# Patient Record
Sex: Male | Born: 1960 | Race: Black or African American | Hispanic: No | Marital: Single | Smoking: Current every day smoker
Health system: Southern US, Community
[De-identification: ages and names within clinical notes are randomized; demographics above are authoritative.]

## PROBLEM LIST (undated history)

## (undated) DIAGNOSIS — E119 Type 2 diabetes mellitus without complications: Secondary | ICD-10-CM

## (undated) DIAGNOSIS — K219 Gastro-esophageal reflux disease without esophagitis: Secondary | ICD-10-CM

## (undated) DIAGNOSIS — I10 Essential (primary) hypertension: Secondary | ICD-10-CM

## (undated) HISTORY — DX: Type 2 diabetes mellitus without complications: E11.9

## (undated) HISTORY — DX: Essential (primary) hypertension: I10

## (undated) HISTORY — DX: Gastro-esophageal reflux disease without esophagitis: K21.9

---

## 2010-09-27 ENCOUNTER — Ambulatory Visit: Payer: Self-pay | Admitting: Internal Medicine

## 2020-03-12 ENCOUNTER — Telehealth: Payer: Self-pay

## 2020-03-12 NOTE — Telephone Encounter (Signed)
Contacted patient for lung screening CT program based on referral from Dr. Fitzgerald.  Message left with patient to call Shawn Perkins, lung navigator to schedule CT scan.  °

## 2020-03-18 ENCOUNTER — Encounter: Payer: Self-pay | Admitting: *Deleted

## 2020-03-18 ENCOUNTER — Other Ambulatory Visit: Payer: Self-pay

## 2020-03-18 ENCOUNTER — Encounter: Payer: Managed Care, Other (non HMO) | Attending: Infectious Diseases | Admitting: *Deleted

## 2020-03-18 VITALS — BP 138/84 | Ht 74.0 in | Wt 313.1 lb

## 2020-03-18 DIAGNOSIS — E119 Type 2 diabetes mellitus without complications: Secondary | ICD-10-CM | POA: Diagnosis not present

## 2020-03-18 DIAGNOSIS — Z713 Dietary counseling and surveillance: Secondary | ICD-10-CM | POA: Insufficient documentation

## 2020-03-18 DIAGNOSIS — Z6841 Body Mass Index (BMI) 40.0 and over, adult: Secondary | ICD-10-CM | POA: Diagnosis not present

## 2020-03-18 NOTE — Progress Notes (Signed)
Diabetes Self-Management Education  Visit Type: First/Initial  Appt. Start Time: 1600 Appt. End Time: 1710  03/18/2020  Mr. Shane Caldwell, identified by name and date of birth, is a 59 y.o. male with a diagnosis of Diabetes: Type 2.   ASSESSMENT  Blood pressure 138/84, height 6\' 2"  (1.88 m), weight (!) 313 lb 1.6 oz (142 kg). Body mass index is 40.2 kg/m.   Diabetes Self-Management Education - 03/18/20 1807      Visit Information   Visit Type First/Initial      Initial Visit   Diabetes Type Type 2    Are you currently following a meal plan? No    Are you taking your medications as prescribed? Yes    Date Diagnosed September 2021      Health Coping   How would you rate your overall health? Fair      Psychosocial Assessment   Patient Belief/Attitude about Diabetes Motivated to manage diabetes   "family history, it's ok"   Self-care barriers None    Self-management support Doctor's office;Friends;Family    Patient Concerns Nutrition/Meal planning;Weight Control;Medication;Healthy Lifestyle;Problem Solving;Glycemic Control    Special Needs None    Preferred Learning Style Auditory    Learning Readiness Change in progress    How often do you need to have someone help you when you read instructions, pamphlets, or other written materials from your doctor or pharmacy? 1 - Never    What is the last grade level you completed in school? 12th      Pre-Education Assessment   Patient understands the diabetes disease and treatment process. Needs Instruction    Patient understands incorporating nutritional management into lifestyle. Needs Instruction    Patient undertands incorporating physical activity into lifestyle. Needs Instruction    Patient understands using medications safely. Needs Instruction    Patient understands monitoring blood glucose, interpreting and using results Needs Review    Patient understands prevention, detection, and treatment of acute complications. Needs  Instruction    Patient understands prevention, detection, and treatment of chronic complications. Needs Instruction    Patient understands how to develop strategies to address psychosocial issues. Needs Instruction    Patient understands how to develop strategies to promote health/change behavior. Needs Instruction      Complications   Last HgB A1C per patient/outside source 8.7 %   02/13/2020   How often do you check your blood sugar? 1-2 times/day    Postprandial Blood glucose range (mg/dL) 02/15/2020   Pt reports checking after meals with readings less than 130's mg/dL.   Have you had a dilated eye exam in the past 12 months? Yes    Have you had a dental exam in the past 12 months? No    Are you checking your feet? Yes    How many days per week are you checking your feet? 3      Dietary Intake   Breakfast sausage, egg and cheese biscuit from [employer] or potato wedges    Lunch skips or eats fruit (banana, strawberries, peach, orange)  or cereal and milk    Dinner chicken, beef, pork, fish with potatoes, corn, beans, rice, broccoli, cauliflower, salad, kale, lettuce, tomatoes, cuccumbers    Beverage(s) water, regular soda, coffee      Exercise   Exercise Type ADL's      Patient Education   Previous Diabetes Education No    Disease state  Definition of diabetes, type 1 and 2, and the diagnosis of diabetes;Factors that contribute to the  development of diabetes    Nutrition management  Role of diet in the treatment of diabetes and the relationship between the three main macronutrients and blood glucose level;Food label reading, portion sizes and measuring food.;Reviewed blood glucose goals for pre and post meals and how to evaluate the patients' food intake on their blood glucose level.;Information on hints to eating out and maintain blood glucose control.    Physical activity and exercise  Role of exercise on diabetes management, blood pressure control and cardiac health.     Medications Reviewed patients medication for diabetes, action, purpose, timing of dose and side effects.    Monitoring Purpose and frequency of SMBG.;Taught/discussed recording of test results and interpretation of SMBG.;Identified appropriate SMBG and/or A1C goals.    Chronic complications Relationship between chronic complications and blood glucose control    Psychosocial adjustment Identified and addressed patients feelings and concerns about diabetes    Personal strategies to promote health Review risk of smoking and offered smoking cessation      Individualized Goals (developed by patient)   Reducing Risk Other (comment)   improve blood sugars, decrease medications, lose weight, lead a healthier lifestyle, quit smoking, become more fit     Outcomes   Expected Outcomes Demonstrated interest in learning. Expect positive outcomes    Future DMSE --   3 weeks          Individualized Plan for Diabetes Self-Management Training:   Learning Objective:  Patient will have a greater understanding of diabetes self-management. Patient education plan is to attend individual and/or group sessions per assessed needs and concerns.   Plan:   Patient Instructions  Check blood sugars 1 x day before breakfast or 2 hrs after one meal every day Bring blood sugar records to the next appointment Exercise: Begin walking  for  15  minutes  3  days a week and gradually increase to 30 minutes 5 x week Eat 3 meals day, 1-2  snacks a day Space meals 4-6 hours apart Don't skip meals Avoid sugar sweetened drinks (soda) Limit fried foods and foods high in fat and desserts/sweets Return for appointment on:  Monday April 08, 2020 at 4:00 pm with Reanna (dietitian)  Expected Outcomes:  Demonstrated interest in learning. Expect positive outcomes  Education material provided:  General Meal Planning Guidelines Simple Meal Plan  If problems or questions, patient to contact team via:  Sharion Settler, RN, CCM,  CDCES [phone]  Future DSME appointment:  (3 weeks)  April 08, 2020 with the dietitian

## 2020-03-18 NOTE — Patient Instructions (Addendum)
Check blood sugars 1 x day before breakfast or 2 hrs after one meal every day Bring blood sugar records to the next appointment  Exercise: Begin walking  for  15  minutes  3  days a week and gradually increase to 30 minutes 5 x week  Eat 3 meals day, 1-2  snacks a day Space meals 4-6 hours apart Don't skip meals Avoid sugar sweetened drinks (soda) Limit fried foods and foods high in fat and desserts/sweets  Return for appointment on:  Monday April 08, 2020 at 4:00 pm with Reanna (dietitian)

## 2020-03-26 ENCOUNTER — Telehealth: Payer: Self-pay

## 2020-03-26 NOTE — Telephone Encounter (Signed)
Contacted patient for lung CT screening program based on referral from Dr. Fitzgerald.  Message left for patient to call Shawn Perkins, lung navigator to schedule CT scan.  

## 2020-04-08 ENCOUNTER — Telehealth: Payer: Self-pay

## 2020-04-08 ENCOUNTER — Ambulatory Visit: Payer: Managed Care, Other (non HMO) | Admitting: Dietician

## 2020-04-08 NOTE — Telephone Encounter (Signed)
Contacted patient for lung CT screening program based on referral from Dr. Sampson Goon.  Message left for patient to call Glenna Fellows, lung navigator at [phone] to schedule CT scan.

## 2020-04-09 ENCOUNTER — Encounter: Payer: Self-pay | Admitting: *Deleted

## 2020-04-09 ENCOUNTER — Encounter: Payer: Self-pay | Admitting: Dietician

## 2020-04-09 NOTE — Progress Notes (Signed)
Left message yesterday (11/15) for patient regarding missed follow up diabetes education appointment with dietitian.  Instructed patient to call if interested in rescheduling appointment.

## 2020-05-09 ENCOUNTER — Encounter: Payer: Self-pay | Admitting: Dietician

## 2020-05-09 NOTE — Progress Notes (Signed)
Pt did not show for 11/15 appointment and has not contacted our office to reschedule.  Pt is being discharged.

## 2020-11-11 ENCOUNTER — Inpatient Hospital Stay (HOSPITAL_COMMUNITY): Payer: 59

## 2020-11-11 ENCOUNTER — Emergency Department (HOSPITAL_COMMUNITY): Payer: 59

## 2020-11-11 ENCOUNTER — Inpatient Hospital Stay (HOSPITAL_COMMUNITY)
Admission: EM | Admit: 2020-11-11 | Discharge: 2020-11-22 | DRG: 296 | Disposition: E | Payer: 59 | Attending: Pulmonary Disease | Admitting: Pulmonary Disease

## 2020-11-11 DIAGNOSIS — J9601 Acute respiratory failure with hypoxia: Secondary | ICD-10-CM | POA: Diagnosis present

## 2020-11-11 DIAGNOSIS — Z79899 Other long term (current) drug therapy: Secondary | ICD-10-CM

## 2020-11-11 DIAGNOSIS — I4892 Unspecified atrial flutter: Secondary | ICD-10-CM | POA: Diagnosis present

## 2020-11-11 DIAGNOSIS — F17210 Nicotine dependence, cigarettes, uncomplicated: Secondary | ICD-10-CM | POA: Diagnosis present

## 2020-11-11 DIAGNOSIS — I10 Essential (primary) hypertension: Secondary | ICD-10-CM | POA: Diagnosis present

## 2020-11-11 DIAGNOSIS — I4510 Unspecified right bundle-branch block: Secondary | ICD-10-CM | POA: Diagnosis present

## 2020-11-11 DIAGNOSIS — G9382 Brain death: Secondary | ICD-10-CM | POA: Diagnosis present

## 2020-11-11 DIAGNOSIS — K219 Gastro-esophageal reflux disease without esophagitis: Secondary | ICD-10-CM | POA: Diagnosis present

## 2020-11-11 DIAGNOSIS — G931 Anoxic brain damage, not elsewhere classified: Secondary | ICD-10-CM | POA: Diagnosis present

## 2020-11-11 DIAGNOSIS — Z833 Family history of diabetes mellitus: Secondary | ICD-10-CM | POA: Diagnosis not present

## 2020-11-11 DIAGNOSIS — N170 Acute kidney failure with tubular necrosis: Secondary | ICD-10-CM | POA: Diagnosis present

## 2020-11-11 DIAGNOSIS — Z66 Do not resuscitate: Secondary | ICD-10-CM | POA: Diagnosis not present

## 2020-11-11 DIAGNOSIS — G936 Cerebral edema: Secondary | ICD-10-CM | POA: Diagnosis present

## 2020-11-11 DIAGNOSIS — Z6841 Body Mass Index (BMI) 40.0 and over, adult: Secondary | ICD-10-CM | POA: Diagnosis not present

## 2020-11-11 DIAGNOSIS — E876 Hypokalemia: Secondary | ICD-10-CM | POA: Diagnosis present

## 2020-11-11 DIAGNOSIS — I469 Cardiac arrest, cause unspecified: Principal | ICD-10-CM

## 2020-11-11 DIAGNOSIS — Z7984 Long term (current) use of oral hypoglycemic drugs: Secondary | ICD-10-CM

## 2020-11-11 DIAGNOSIS — K559 Vascular disorder of intestine, unspecified: Secondary | ICD-10-CM | POA: Diagnosis present

## 2020-11-11 DIAGNOSIS — E874 Mixed disorder of acid-base balance: Secondary | ICD-10-CM | POA: Diagnosis present

## 2020-11-11 DIAGNOSIS — Z20822 Contact with and (suspected) exposure to covid-19: Secondary | ICD-10-CM | POA: Diagnosis present

## 2020-11-11 DIAGNOSIS — Z452 Encounter for adjustment and management of vascular access device: Secondary | ICD-10-CM

## 2020-11-11 DIAGNOSIS — E1165 Type 2 diabetes mellitus with hyperglycemia: Secondary | ICD-10-CM | POA: Diagnosis present

## 2020-11-11 DIAGNOSIS — I472 Ventricular tachycardia: Secondary | ICD-10-CM | POA: Diagnosis present

## 2020-11-11 DIAGNOSIS — R570 Cardiogenic shock: Secondary | ICD-10-CM

## 2020-11-11 DIAGNOSIS — I249 Acute ischemic heart disease, unspecified: Secondary | ICD-10-CM | POA: Diagnosis present

## 2020-11-11 LAB — RESP PANEL BY RT-PCR (FLU A&B, COVID) ARPGX2
Influenza A by PCR: NEGATIVE
Influenza B by PCR: NEGATIVE
SARS Coronavirus 2 by RT PCR: NEGATIVE

## 2020-11-11 LAB — I-STAT VENOUS BLOOD GAS, ED
Acid-base deficit: 22 mmol/L — ABNORMAL HIGH (ref 0.0–2.0)
Bicarbonate: 13.7 mmol/L — ABNORMAL LOW (ref 20.0–28.0)
Calcium, Ion: 1.49 mmol/L — ABNORMAL HIGH (ref 1.15–1.40)
HCT: 40 % (ref 39.0–52.0)
Hemoglobin: 13.6 g/dL (ref 13.0–17.0)
O2 Saturation: 45 %
Potassium: 4.2 mmol/L (ref 3.5–5.1)
Sodium: 134 mmol/L — ABNORMAL LOW (ref 135–145)
TCO2: 16 mmol/L — ABNORMAL LOW (ref 22–32)
pCO2, Ven: 84.9 mmHg (ref 44.0–60.0)
pH, Ven: 6.814 — CL (ref 7.250–7.430)
pO2, Ven: 46 mmHg — ABNORMAL HIGH (ref 32.0–45.0)

## 2020-11-11 LAB — I-STAT ARTERIAL BLOOD GAS, ED
Acid-base deficit: 12 mmol/L — ABNORMAL HIGH (ref 0.0–2.0)
Acid-base deficit: 14 mmol/L — ABNORMAL HIGH (ref 0.0–2.0)
Bicarbonate: 18.5 mmol/L — ABNORMAL LOW (ref 20.0–28.0)
Bicarbonate: 16.7 mmol/L — ABNORMAL LOW (ref 20.0–28.0)
Calcium, Ion: 1.5 mmol/L — ABNORMAL HIGH (ref 1.15–1.40)
Calcium, Ion: 1.28 mmol/L (ref 1.15–1.40)
HCT: 44 % (ref 39.0–52.0)
HCT: 41 % (ref 39.0–52.0)
Hemoglobin: 15 g/dL (ref 13.0–17.0)
Hemoglobin: 13.9 g/dL (ref 13.0–17.0)
O2 Saturation: 85 %
O2 Saturation: 62 %
Patient temperature: 97.3
Patient temperature: 97.3
Potassium: 4.2 mmol/L (ref 3.5–5.1)
Potassium: 2.8 mmol/L — ABNORMAL LOW (ref 3.5–5.1)
Sodium: 134 mmol/L — ABNORMAL LOW (ref 135–145)
Sodium: 138 mmol/L (ref 135–145)
TCO2: 20 mmol/L — ABNORMAL LOW (ref 22–32)
TCO2: 19 mmol/L — ABNORMAL LOW (ref 22–32)
pCO2 arterial: 59.5 mmHg — ABNORMAL HIGH (ref 32.0–48.0)
pCO2 arterial: 58.4 mmHg — ABNORMAL HIGH (ref 32.0–48.0)
pH, Arterial: 7.097 — CL (ref 7.350–7.450)
pH, Arterial: 7.061 — CL (ref 7.350–7.450)
pO2, Arterial: 65 mmHg — ABNORMAL LOW (ref 83.0–108.0)
pO2, Arterial: 44 mmHg — ABNORMAL LOW (ref 83.0–108.0)

## 2020-11-11 LAB — POCT I-STAT 7, (LYTES, BLD GAS, ICA,H+H)
Acid-base deficit: 15 mmol/L — ABNORMAL HIGH (ref 0.0–2.0)
Bicarbonate: 16.6 mmol/L — ABNORMAL LOW (ref 20.0–28.0)
Calcium, Ion: 1.36 mmol/L (ref 1.15–1.40)
HCT: 44 % (ref 39.0–52.0)
Hemoglobin: 15 g/dL (ref 13.0–17.0)
O2 Saturation: 59 %
Patient temperature: 97.7
Potassium: 3.3 mmol/L — ABNORMAL LOW (ref 3.5–5.1)
Sodium: 135 mmol/L (ref 135–145)
TCO2: 19 mmol/L — ABNORMAL LOW (ref 22–32)
pCO2 arterial: 63.1 mmHg — ABNORMAL HIGH (ref 32.0–48.0)
pH, Arterial: 7.025 — CL (ref 7.350–7.450)
pO2, Arterial: 44 mmHg — ABNORMAL LOW (ref 83.0–108.0)

## 2020-11-11 LAB — CBC WITH DIFFERENTIAL/PLATELET
Abs Immature Granulocytes: 0 10*3/uL (ref 0.00–0.07)
Basophils Absolute: 0 10*3/uL (ref 0.0–0.1)
Basophils Relative: 0 %
Eosinophils Absolute: 0.3 10*3/uL (ref 0.0–0.5)
Eosinophils Relative: 3 %
HCT: 42.7 % (ref 39.0–52.0)
Hemoglobin: 13 g/dL (ref 13.0–17.0)
Lymphocytes Relative: 56 %
Lymphs Abs: 5.4 10*3/uL — ABNORMAL HIGH (ref 0.7–4.0)
MCH: 31.9 pg (ref 26.0–34.0)
MCHC: 30.4 g/dL (ref 30.0–36.0)
MCV: 104.7 fL — ABNORMAL HIGH (ref 80.0–100.0)
Monocytes Absolute: 0.4 10*3/uL (ref 0.1–1.0)
Monocytes Relative: 4 %
Neutro Abs: 3.6 10*3/uL (ref 1.7–7.7)
Neutrophils Relative %: 37 %
Platelets: 169 10*3/uL (ref 150–400)
RBC: 4.08 MIL/uL — ABNORMAL LOW (ref 4.22–5.81)
RDW: 13.9 % (ref 11.5–15.5)
WBC: 9.7 10*3/uL (ref 4.0–10.5)
nRBC: 0 /100 WBC
nRBC: 0.6 % — ABNORMAL HIGH (ref 0.0–0.2)

## 2020-11-11 LAB — BASIC METABOLIC PANEL
Anion gap: 18 — ABNORMAL HIGH (ref 5–15)
Anion gap: 21 — ABNORMAL HIGH (ref 5–15)
BUN: 15 mg/dL (ref 6–20)
BUN: 19 mg/dL (ref 6–20)
CO2: 17 mmol/L — ABNORMAL LOW (ref 22–32)
CO2: 15 mmol/L — ABNORMAL LOW (ref 22–32)
Calcium: 10.5 mg/dL — ABNORMAL HIGH (ref 8.9–10.3)
Calcium: 9.7 mg/dL (ref 8.9–10.3)
Chloride: 100 mmol/L (ref 98–111)
Chloride: 97 mmol/L — ABNORMAL LOW (ref 98–111)
Creatinine, Ser: 2.64 mg/dL — ABNORMAL HIGH (ref 0.61–1.24)
Creatinine, Ser: 3.53 mg/dL — ABNORMAL HIGH (ref 0.61–1.24)
GFR, Estimated: 27 mL/min — ABNORMAL LOW (ref >60)
GFR, Estimated: 19 mL/min — ABNORMAL LOW (ref >60)
Glucose, Bld: 669 mg/dL (ref 70–99)
Glucose, Bld: 710 mg/dL (ref 70–99)
Potassium: 4 mmol/L (ref 3.5–5.1)
Potassium: 3.3 mmol/L — ABNORMAL LOW (ref 3.5–5.1)
Sodium: 135 mmol/L (ref 135–145)
Sodium: 133 mmol/L — ABNORMAL LOW (ref 135–145)

## 2020-11-11 LAB — I-STAT CHEM 8, ED
BUN: 16 mg/dL (ref 6–20)
Calcium, Ion: 1.51 mmol/L (ref 1.15–1.40)
Chloride: 100 mmol/L (ref 98–111)
Creatinine, Ser: 1.8 mg/dL — ABNORMAL HIGH (ref 0.61–1.24)
Glucose, Bld: 667 mg/dL (ref 70–99)
HCT: 40 % (ref 39.0–52.0)
Hemoglobin: 13.6 g/dL (ref 13.0–17.0)
Potassium: 4.2 mmol/L (ref 3.5–5.1)
Sodium: 134 mmol/L — ABNORMAL LOW (ref 135–145)
TCO2: 20 mmol/L — ABNORMAL LOW (ref 22–32)

## 2020-11-11 LAB — GLUCOSE, CAPILLARY
Glucose-Capillary: 598 mg/dL (ref 70–99)
Glucose-Capillary: >600 mg/dL (ref 70–99)
Glucose-Capillary: >600 mg/dL (ref 70–99)

## 2020-11-11 LAB — COMPREHENSIVE METABOLIC PANEL
ALT: 127 U/L — ABNORMAL HIGH (ref 0–44)
AST: 189 U/L — ABNORMAL HIGH (ref 15–41)
Albumin: 2.9 g/dL — ABNORMAL LOW (ref 3.5–5.0)
Alkaline Phosphatase: 69 U/L (ref 38–126)
Anion gap: 21 — ABNORMAL HIGH (ref 5–15)
BUN: 14 mg/dL (ref 6–20)
CO2: 16 mmol/L — ABNORMAL LOW (ref 22–32)
Calcium: 11.6 mg/dL — ABNORMAL HIGH (ref 8.9–10.3)
Chloride: 97 mmol/L — ABNORMAL LOW (ref 98–111)
Creatinine, Ser: 2.04 mg/dL — ABNORMAL HIGH (ref 0.61–1.24)
GFR, Estimated: 37 mL/min — ABNORMAL LOW (ref >60)
Glucose, Bld: 739 mg/dL (ref 70–99)
Potassium: 4.4 mmol/L (ref 3.5–5.1)
Sodium: 134 mmol/L — ABNORMAL LOW (ref 135–145)
Total Bilirubin: 0.7 mg/dL (ref 0.3–1.2)
Total Protein: 5.1 g/dL — ABNORMAL LOW (ref 6.5–8.1)

## 2020-11-11 LAB — CBG MONITORING, ED: Glucose-Capillary: >600 mg/dL (ref 70–99)

## 2020-11-11 LAB — TROPONIN I (HIGH SENSITIVITY)
Troponin I (High Sensitivity): 1664 ng/L (ref <18)
Troponin I (High Sensitivity): 7896 ng/L (ref <18)

## 2020-11-11 LAB — LACTIC ACID, PLASMA
Lactic Acid, Venous: 10.7 mmol/L (ref 0.5–1.9)
Lactic Acid, Venous: >11 mmol/L (ref 0.5–1.9)

## 2020-11-11 LAB — BETA-HYDROXYBUTYRIC ACID: Beta-Hydroxybutyric Acid: 0.25 mmol/L (ref 0.05–0.27)

## 2020-11-11 MED ORDER — SODIUM CHLORIDE 0.9 % IV SOLN: 250.0000 mL | INTRAVENOUS | Status: DC

## 2020-11-11 MED ORDER — NOREPINEPHRINE 4 MG/250ML-% IV SOLN
INTRAVENOUS | Status: AC
  Filled 2020-11-11: qty 250

## 2020-11-11 MED ORDER — SODIUM CHLORIDE 0.9% FLUSH
10.0000 mL | Freq: Two times a day (BID) | INTRAVENOUS | Status: DC
Start: 2020-11-11 — End: 2020-11-12
  Administered 2020-11-11 – 2020-11-12 (×2): 10 mL

## 2020-11-11 MED ORDER — INSULIN REGULAR(HUMAN) IN NACL 100-0.9 UT/100ML-% IV SOLN
INTRAVENOUS | Status: DC
  Administered 2020-11-11: 13 [IU]/h via INTRAVENOUS
  Administered 2020-11-12: 17 [IU]/h via INTRAVENOUS
  Administered 2020-11-12: 28 [IU]/h via INTRAVENOUS
  Filled 2020-11-11 (×3): qty 100

## 2020-11-11 MED ORDER — EPINEPHRINE HCL 5 MG/250ML IV SOLN IN NS
0.5000 ug/min | INTRAVENOUS | Status: DC
  Administered 2020-11-11 – 2020-11-12 (×2): 30 ug/min via INTRAVENOUS
  Filled 2020-11-11 (×4): qty 250

## 2020-11-11 MED ORDER — MIDAZOLAM HCL 2 MG/2ML IJ SOLN: 2.0000 mg | INTRAMUSCULAR | Status: DC | PRN

## 2020-11-11 MED ORDER — STERILE WATER FOR INJECTION IV SOLN
INTRAVENOUS | Status: DC
  Filled 2020-11-11 (×3): qty 1000

## 2020-11-11 MED ORDER — NOREPINEPHRINE 16 MG/250ML-% IV SOLN
5.0000 ug/min | INTRAVENOUS | Status: DC
  Administered 2020-11-11: 40 ug/min via INTRAVENOUS
  Administered 2020-11-12: 50 ug/min via INTRAVENOUS
  Filled 2020-11-11 (×3): qty 250

## 2020-11-11 MED ORDER — CALCIUM GLUCONATE 10 % IV SOLN
INTRAVENOUS | Status: AC
  Filled 2020-11-11: qty 10

## 2020-11-11 MED ORDER — DOCUSATE SODIUM 100 MG PO CAPS: 100.0000 mg | ORAL_CAPSULE | Freq: Two times a day (BID) | ORAL | Status: DC | PRN

## 2020-11-11 MED ORDER — NOREPINEPHRINE 4 MG/250ML-% IV SOLN
5.0000 ug/min | INTRAVENOUS | Status: DC
  Administered 2020-11-11: 50 ug/min via INTRAVENOUS
  Administered 2020-11-11: 60 ug/min via INTRAVENOUS
  Filled 2020-11-11 (×5): qty 250

## 2020-11-11 MED ORDER — FENTANYL CITRATE (PF) 100 MCG/2ML IJ SOLN: 100.0000 ug | INTRAMUSCULAR | Status: DC | PRN

## 2020-11-11 MED ORDER — SODIUM CHLORIDE 0.9 % IV SOLN: INTRAVENOUS | Status: DC

## 2020-11-11 MED ORDER — VASOPRESSIN 20 UNITS/100 ML INFUSION FOR SHOCK
0.0400 [IU]/min | INTRAVENOUS | Status: DC
  Administered 2020-11-11 – 2020-11-12 (×3): 0.04 [IU]/min via INTRAVENOUS
  Filled 2020-11-11 (×3): qty 100

## 2020-11-11 MED ORDER — PANTOPRAZOLE SODIUM 40 MG PO PACK
40.0000 mg | PACK | Freq: Every day | ORAL | Status: DC
  Administered 2020-11-12: 40 mg
  Filled 2020-11-11 (×2): qty 20

## 2020-11-11 MED ORDER — EPINEPHRINE 1 MG/10ML IJ SOSY
PREFILLED_SYRINGE | INTRAMUSCULAR | Status: AC | PRN
  Administered 2020-11-11 (×2): 1 mg via INTRAVENOUS
  Administered 2020-11-11: 0.5 mg via INTRAVENOUS
  Administered 2020-11-11 (×2): 1 mg via INTRAVENOUS

## 2020-11-11 MED ORDER — DEXTROSE 50 % IV SOLN: 0.0000 mL | INTRAVENOUS | Status: DC | PRN

## 2020-11-11 MED ORDER — NOREPINEPHRINE 4 MG/250ML-% IV SOLN
INTRAVENOUS | Status: AC | PRN
  Administered 2020-11-11: 20 ug/min via INTRAVENOUS

## 2020-11-11 MED ORDER — POLYETHYLENE GLYCOL 3350 17 G PO PACK: 17.0000 g | PACK | Freq: Every day | ORAL | Status: DC | PRN

## 2020-11-11 MED ORDER — SODIUM CHLORIDE 0.9 % IV BOLUS: 500.0000 mL | Freq: Once | INTRAVENOUS | Status: DC

## 2020-11-11 MED ORDER — POTASSIUM CHLORIDE 10 MEQ/100ML IV SOLN
10.0000 meq | INTRAVENOUS | Status: AC
  Administered 2020-11-12 (×6): 10 meq via INTRAVENOUS
  Filled 2020-11-11 (×6): qty 100

## 2020-11-11 MED ORDER — DEXTROSE IN LACTATED RINGERS 5 % IV SOLN: INTRAVENOUS | Status: DC

## 2020-11-11 MED ORDER — SODIUM CHLORIDE 0.9 % IV SOLN
3.0000 g | Freq: Four times a day (QID) | INTRAVENOUS | Status: DC
  Administered 2020-11-11: 3 g via INTRAVENOUS
  Filled 2020-11-11 (×10): qty 8

## 2020-11-11 MED ORDER — NOREPINEPHRINE BITARTRATE 1 MG/ML IV SOLN: INTRAVENOUS | Status: AC | PRN

## 2020-11-11 MED ORDER — CALCIUM CHLORIDE 10 % IV SOLN
INTRAVENOUS | Status: AC
  Filled 2020-11-11: qty 10

## 2020-11-11 MED ORDER — EPINEPHRINE HCL 5 MG/250ML IV SOLN IN NS
INTRAVENOUS | Status: AC
  Filled 2020-11-11: qty 250

## 2020-11-11 MED ORDER — CALCIUM CHLORIDE 10 % IV SOLN
INTRAVENOUS | Status: AC | PRN
  Administered 2020-11-11 (×3): 1 g via INTRAVENOUS

## 2020-11-11 MED ORDER — LACTATED RINGERS IV SOLN: INTRAVENOUS | Status: DC

## 2020-11-11 MED ORDER — CHLORHEXIDINE GLUCONATE 0.12% ORAL RINSE (MEDLINE KIT)
15.0000 mL | Freq: Two times a day (BID) | OROMUCOSAL | Status: DC
  Administered 2020-11-11 – 2020-11-12 (×2): 15 mL via OROMUCOSAL

## 2020-11-11 MED ORDER — ORAL CARE MOUTH RINSE
15.0000 mL | OROMUCOSAL | Status: DC
  Administered 2020-11-12 (×5): 15 mL via OROMUCOSAL

## 2020-11-11 MED ORDER — SODIUM BICARBONATE 8.4 % IV SOLN
100.0000 meq | Freq: Once | INTRAVENOUS | Status: AC
  Administered 2020-11-11: 100 meq via INTRAVENOUS
  Filled 2020-11-11: qty 50

## 2020-11-11 MED ORDER — CHLORHEXIDINE GLUCONATE CLOTH 2 % EX PADS
6.0000 | MEDICATED_PAD | Freq: Every day | CUTANEOUS | Status: DC
  Administered 2020-11-11: 6 via TOPICAL

## 2020-11-11 MED ORDER — LACTATED RINGERS IV BOLUS
500.0000 mL | Freq: Once | INTRAVENOUS | Status: AC
  Administered 2020-11-11: 500 mL via INTRAVENOUS

## 2020-11-11 MED ORDER — SODIUM BICARBONATE 8.4 % IV SOLN
INTRAVENOUS | Status: AC | PRN
  Administered 2020-11-11 (×2): 50 meq via INTRAVENOUS

## 2020-11-11 MED ORDER — SODIUM CHLORIDE 0.9% FLUSH: 10.0000 mL | INTRAVENOUS | Status: DC | PRN

## 2020-11-11 MED ORDER — LACTATED RINGERS IV BOLUS
1000.0000 mL | Freq: Once | INTRAVENOUS | Status: AC
  Administered 2020-11-12: 1000 mL via INTRAVENOUS

## 2020-11-11 NOTE — Progress Notes (Signed)
Pharmacy Antibiotic Note  Shane Caldwell is a 60 y.o. male admitted on 10/27/2020 with suspected aspiration pneumonia s/p cardiac arrest with prolonged downtime.  Pharmacy has been consulted for Unasyn dosing.  Plan: Unasyn 3g IV Q6H.  Height: 6\' 2"  (188 cm) Weight: (!) 142 kg (313 lb) IBW/kg (Calculated) : 82.2  No data recorded.  Recent Labs  Lab 11/07/2020 1402 11/16/2020 1405  WBC 9.7  --   CREATININE 2.04* 1.80*    Estimated Creatinine Clearance: 66.3 mL/min (A) (by C-G formula based on SCr of 1.8 mg/dL (H)).    No Known Allergies   Thank you for allowing pharmacy to be a part of this patient's care.  11/13/20, PharmD, BCPS  10/31/2020 6:46 PM

## 2020-11-11 NOTE — ED Notes (Signed)
Pt transported to room with RT, NT, and RN

## 2020-11-11 NOTE — ED Notes (Signed)
Notified provider about pt BP map dropping. Adjusted Levo to 60 mcg.

## 2020-11-11 NOTE — Code Documentation (Signed)
Pulse check.   

## 2020-11-11 NOTE — Progress Notes (Addendum)
eLink Physician-Brief Progress Note Patient Name: Shane Caldwell DOB: 09/23/1960 MRN: [id number]   Date of Service  Dec 11, 2020  HPI/Events of Note  58 M morbidly obese (BMI 40) became unresponsive. PEA arrest. ROSC on 3 pressors. Deemed not a candidate for TTM. Approximate down time 60 min  eICU Interventions  Head CT yet to be done Metabolic acidosis secondary to lactic acidosis and AKI, on bicarbonate drip Hyperglycemia not in DKA, insulin drip Shock, multiple pressors. Will give fluid bolus. Reportedly no fluids given in the ED     Intervention Category Evaluation Type: New Patient Evaluation  Darl Pikes 2020-12-11, 10:07 PM

## 2020-11-11 NOTE — ED Notes (Signed)
Provider at the bedside. Informed about critical values. RT at the bedside.

## 2020-11-11 NOTE — ED Provider Notes (Addendum)
[facility] EMERGENCY DEPARTMENT Provider Note   CSN: [id number] Arrival date & time: 2020-11-19  1346     History No chief complaint on file.   Benen Weida is a 60 y.o. male.  60 yo M with a chief complaints of a witnessed arrest.  The patient was in a car with a male companion and she said that he got hot and sweaty and lost consciousness.  EMS arrived and found him to be pulseless and started CPR.  I were doing CPR for about 40 minutes upon arrival to the ED.  Have received 7 bolus doses of epi.  King airway in place. Level 5 caveat acuity of condition.       Past Medical History:  Diagnosis Date   Diabetes mellitus without complication (HCC)    GERD (gastroesophageal reflux disease)    Hypertension     There are no problems to display for this patient.   No past surgical history on file.     Family History  Problem Relation Age of Onset   Diabetes Mother    Diabetes Father    Diabetes Sister     Social History   Tobacco Use   Smoking status: Every Day    Packs/day: 1.00    Years: 41.00    Pack years: 41.00    Types: Cigarettes   Smokeless tobacco: Never  Substance Use Topics   Alcohol use: Not Currently    Comment: occasional light  beer    Home Medications Prior to Admission medications   Medication Sig Start Date End Date Taking? Authorizing Provider  amLODipine (NORVASC) 10 MG tablet Take 1 tablet by mouth daily. 02/13/20 02/12/21  [provider]  Cyanocobalamin (VITAMIN B-12 PO) Take 2,500 mg by mouth daily.    [provider]  losartan-hydrochlorothiazide (HYZAAR) 50-12.5 MG tablet Take 1 tablet by mouth daily. 02/13/20 02/12/21  [provider]  metFORMIN (GLUCOPHAGE) 500 MG tablet Take 1 tablet by mouth daily with breakfast. 03/05/20 03/05/21  [provider]  Multiple Vitamin (MULTIVITAMIN) tablet Take 1 tablet by mouth daily.    [provider]  omeprazole (PRILOSEC) 40 MG capsule  Take 1 capsule by mouth daily. 02/13/20 02/12/21  [provider]  sildenafil (VIAGRA) 100 MG tablet Take by mouth. 03/05/20 04/04/20  [provider]    Allergies    Patient has no known allergies.  Review of Systems   Review of Systems  Unable to perform ROS: Acuity of condition   Physical Exam Updated Vital Signs BP (!) 92/54   Resp (!) 22   Physical Exam Vitals and nursing note reviewed.  Constitutional:      Appearance: He is well-developed.  HENT:     Head: Normocephalic and atraumatic.  Eyes:     Pupils: Pupils are equal, round, and reactive to light.  Neck:     Vascular: No JVD.  Cardiovascular:     Rate and Rhythm: Normal rate and regular rhythm.     Heart sounds: No murmur heard.   No friction rub. No gallop.  Pulmonary:     Effort: No respiratory distress.     Breath sounds: No wheezing.  Abdominal:     General: There is no distension.     Tenderness: There is no abdominal tenderness. There is no guarding or rebound.  Musculoskeletal:        General: Normal range of motion.     Cervical back: Normal range of motion and neck supple.  Skin:    Coloration: Skin is not pale.     Findings: No rash.  Neurological:     Comments: GCS 3.  Pupils 4 mm and minimally reactive.    ED Results / Procedures / Treatments   Labs (all labs ordered are listed, but only abnormal results are displayed) Labs Reviewed  CBC WITH DIFFERENTIAL/PLATELET - Abnormal; Notable for the following components:      Result Value   RBC 4.08 (*)    MCV 104.7 (*)    nRBC 0.6 (*)    All other components within normal limits  CBG MONITORING, ED - Abnormal; Notable for the following components:   Glucose-Capillary >600 (*)    All other components within normal limits  I-STAT CHEM 8, ED - Abnormal; Notable for the following components:   Sodium 134 (*)    Creatinine, Ser 1.80 (*)    Glucose, Bld 667 (*)    Calcium, Ion 1.51 (*)    TCO2 20 (*)    All other components  within normal limits  I-STAT VENOUS BLOOD GAS, ED - Abnormal; Notable for the following components:   pH, Ven 6.814 (*)    pCO2, Ven 84.9 (*)    pO2, Ven 46.0 (*)    Bicarbonate 13.7 (*)    TCO2 16 (*)    Acid-base deficit 22.0 (*)    Sodium 134 (*)    Calcium, Ion 1.49 (*)    All other components within normal limits  RESP PANEL BY RT-PCR (FLU A&B, COVID) ARPGX2  COMPREHENSIVE METABOLIC PANEL  URINALYSIS, ROUTINE W REFLEX MICROSCOPIC  CBG MONITORING, ED    EKG EKG Interpretation  Date/Time:  Monday Dec 03, 2020 13:55:38 EDT Ventricular Rate:  81 PR Interval:    QRS Duration: 124 QT Interval:  395 QTC Calculation: 375 R Axis:   -38 Text Interpretation: Atrial flutter Ventricular bigeminy IVCD, consider atypical RBBB LVH with IVCD, LAD and secondary repol abnrm No old tracing to compare Confirmed by Melene Plan [phone]) on 12-03-2020 2:47:28 PM  Radiology No results found.  Procedures   Procedure Name: Intubation Date/Time: 12/03/2020 3:00 PM Performed by: Melene Plan, DO Pre-anesthesia Checklist: Patient identified, Patient being monitored, Emergency Drugs available, Timeout performed and Suction available Oxygen Delivery Method: Non-rebreather mask Preoxygenation: Pre-oxygenation with 100% oxygen Ventilation: Mask ventilation without difficulty Laryngoscope Size: Glidescope Grade View: Grade I Tube size: 7.5 mm Number of attempts: 1 Airway Equipment and Method: Video-laryngoscopy Placement Confirmation: ETT inserted through vocal cords under direct vision, CO2 detector and Breath sounds checked- equal and bilateral Secured at: 26 cm Tube secured with: ETT holder Dental Injury: Teeth and Oropharynx as per pre-operative assessment  Difficulty Due To: Difficulty was anticipated Future Recommendations: Recommend- induction with short-acting agent, and alternative techniques readily available    Ultrasound ED Echo  Date/Time: 2020-12-03 3:36 PM Performed by: Melene Plan, DO Authorized by: Melene Plan, DO   Procedure details:    Indications: cardiac arrest     Views: subxiphoid and apical 4 chamber view     Images: archived   Findings:    Pericardium: no pericardial effusion     Cardiac Activity: normal cardiac activity     Cardiac Activity comment:  Hypodynamic   LV Function: severly depressed (<30%)     RV Diameter: normal   Impression:    Impression: abnormal cardiac activity and decreased contractility     Medications Ordered in ED Medications  norepinephrine (LEVOPHED) 4-5 MG/250ML-% infusion SOLN (has no administration in time  range)  calcium gluconate 10 % injection (has no administration in time range)  calcium chloride 10 % injection (has no administration in time range)  calcium chloride 10 % injection (has no administration in time range)  EPINEPHrine NaCl 5-0.9 MG/250ML-% premix infusion (has no administration in time range)  sodium bicarbonate 150 mEq in sterile water 1,150 mL infusion (has no administration in time range)  fentaNYL (SUBLIMAZE) injection 100 mcg (has no administration in time range)  fentaNYL (SUBLIMAZE) injection 100 mcg (has no administration in time range)  midazolam (VERSED) injection 2 mg (has no administration in time range)  midazolam (VERSED) injection 2 mg (has no administration in time range)  insulin regular, human (MYXREDLIN) 100 units/ 100 mL infusion (has no administration in time range)  lactated ringers infusion (has no administration in time range)  dextrose 5 % in lactated ringers infusion (has no administration in time range)  dextrose 50 % solution 0-50 mL (has no administration in time range)  EPINEPHrine (ADRENALIN) 1 MG/10ML injection (0.5 mg Intravenous Given 12/08/20 1420)  calcium chloride injection (1 g Intravenous Given 12-08-2020 1407)  sodium bicarbonate injection (50 mEq Intravenous Given 2020/12/08 1409)  norepinephrine (LEVOPHED) injection (20 mcg Intravenous Canceled Entry 12-08-2020 1400)   norepinephrine (LEVOPHED) 4mg  in premix infusion (20 mcg/min Intravenous New Bag/Given 12-08-20 1404)    ED Course  I have reviewed the triage vital signs and the nursing notes.  Pertinent labs & imaging results that were available during my care of the patient were reviewed by me and considered in my medical decision making (see chart for details).    MDM Rules/Calculators/A&P                          60 yo M with a chief complaints of a witnessed cardiac arrest.  The patient arrived and had ongoing CPR.  Patient was noted to be bradycardic with a widened QRS on initial telemetry.  Was given calcium and bicarb with some improvement.  Obtained ROSC but quickly lost pulses.  Occurred multiple times over the course of about an hour.  Patient was placed on pressors.  Found to have decreased cardiac output with norepinephrine and epi was added with improvement.  Intubated.  Found to be profoundly acidotic.  Started on a bicarb drip.  CRITICAL CARE Performed by: 46   Total critical care time: 80 minutes  Critical care time was exclusive of separately billable procedures and treating other patients.  Critical care was necessary to treat or prevent imminent or life-threatening deterioration.  Critical care was time spent personally by me on the following activities: development of treatment plan with patient and/or surrogate as well as nursing, discussions with consultants, evaluation of patient's response to treatment, examination of patient, obtaining history from patient or surrogate, ordering and performing treatments and interventions, ordering and review of laboratory studies, ordering and review of radiographic studies, pulse oximetry and re-evaluation of patient's condition.  Cardiopulmonary Resuscitation (CPR) Procedure Note Directed/Performed by: Rae Roam I personally directed ancillary staff and/or performed CPR in an effort to regain return of  spontaneous circulation and to maintain cardiac, neuro and systemic perfusion.   The patients results and plan were reviewed and discussed.   Any x-rays performed were independently reviewed by myself.   Differential diagnosis were considered with the presenting HPI.  Medications  norepinephrine (LEVOPHED) 4-5 MG/250ML-% infusion SOLN (has no administration in time range)  calcium gluconate 10 % injection (  has no administration in time range)  calcium chloride 10 % injection (has no administration in time range)  calcium chloride 10 % injection (has no administration in time range)  EPINEPHrine NaCl 5-0.9 MG/250ML-% premix infusion (has no administration in time range)  sodium bicarbonate 150 mEq in sterile water 1,150 mL infusion (has no administration in time range)  fentaNYL (SUBLIMAZE) injection 100 mcg (has no administration in time range)  fentaNYL (SUBLIMAZE) injection 100 mcg (has no administration in time range)  midazolam (VERSED) injection 2 mg (has no administration in time range)  midazolam (VERSED) injection 2 mg (has no administration in time range)  insulin regular, human (MYXREDLIN) 100 units/ 100 mL infusion (has no administration in time range)  lactated ringers infusion (has no administration in time range)  dextrose 5 % in lactated ringers infusion (has no administration in time range)  dextrose 50 % solution 0-50 mL (has no administration in time range)  EPINEPHrine (ADRENALIN) 1 MG/10ML injection (0.5 mg Intravenous Given 11/18/2020 1420)  calcium chloride injection (1 g Intravenous Given 11/21/2020 1407)  sodium bicarbonate injection (50 mEq Intravenous Given 10/28/2020 1409)  norepinephrine (LEVOPHED) injection (20 mcg Intravenous Canceled Entry 11/08/2020 1400)  norepinephrine (LEVOPHED) 4mg  in 250mL premix infusion (20 mcg/min Intravenous New Bag/Given 11/09/2020 1404)    Vitals:   10/29/2020 1445  BP: (!) 92/54  Resp: (!) 22    Final diagnoses:  Cardiac arrest [facility](HCC)     Admission/ observation were discussed with the admitting physician, patient and/or family and they are comfortable with the plan.    Final Clinical Impression(s) / ED Diagnoses Final diagnoses:  Cardiac arrest [facility](HCC)    Rx / DC Orders ED Discharge Orders     None        Melene PlanFloyd, Garrie Elenes, DO 10/26/2020 1502    Melene PlanFloyd, Kimothy Kishimoto, DO 11/04/2020 1537

## 2020-11-11 NOTE — Code Documentation (Signed)
Compressions resumed

## 2020-11-11 NOTE — Progress Notes (Signed)
Remains hypotensive Junctional rhythm on monitor, cardiology consult noted ABG >> resp + metab acidosis >> 2 amps bicarb IV, RR limited by autoPEEP on RR 26 Add vaso gtt, max-ed on levo & epi gtt Addn cc time x 85m  Sherran Margolis V. Vassie Loll MD

## 2020-11-11 NOTE — Code Documentation (Signed)
Pulse checks. Compressions resumed.

## 2020-11-11 NOTE — Consult Note (Signed)
Cardiology Consultation:  Patient ID: Baron HamperCurtis Eskridge MRN: [id number]; DOB: 1960-11-01  Admit date: 11/18/2020 Date of Consult: 11/13/2020  Primary Care Provider: Mick SellFitzgerald, David P, MD Primary Cardiologist: None  Primary Electrophysiologist:  None   Patient Profile:  Baron HamperCurtis Charon is a 60 y.o. male with a hx of diabetes, hypertension who is being seen today for the evaluation of cardiac arrest at the request of Cyril Mourningakesh Alva, MD.  History of Present Illness:  Mr. Cliffton AstersWhite presents with cardiac arrest.  He is currently intubated and on multiple pressors.  I am unable to obtain history from him.  History is obtained from the records.  Apparently he became unresponsive after eating and taking pills.  He was found by EMS to be in PEA.  He under went 7 rounds of epinephrine with more than 30 minutes of downtime in the field.  He then arrived to the ER and there are reports of ventricular tachycardia.  I did review the telemetry and it is difficult to determine if he did have VT.  There is one episode that may have been VT.  He did not undergo any shocks.  He underwent additional 20 minutes of resuscitative efforts in the emergency room before ROSC was achieved.  He currently is requiring Levophed and epinephrine.  He is intubated.  He is unresponsive to my examination.  He does not respond to pain.  Pupils appear dilated and unresponsive to light.  I did review his EKGs.  He did have what appears to be sinus node arrest in the setting of severe metabolic acidosis.  He had a junctional rhythm.  I do not believe this was atrial fibrillation.  His telemetry now shows he is in sinus rhythm with heart rate in 90s.  I suspect his acidosis is improving and the EKG will need to be repeated. There is no mention of any prior cardiac history.  The initial rhythm was PEA and not VT or VF.  The VT reported here in the emergency room could strictly be related to metabolic acidosis.  Initial labs show ABG 6.8/84/46.  Chest x-ray  with possible pneumonia.  Serum creatinine 1.8.  Other labs are pending.  Serum glucose value 667.  Heart Pathway Score:       Past Medical History: Past Medical History:  Diagnosis Date   Diabetes mellitus without complication (HCC)    GERD (gastroesophageal reflux disease)    Hypertension     Past Surgical History: No past surgical history on file.   Home Medications:  Prior to Admission medications   Medication Sig Start Date End Date Taking? Authorizing Provider  amLODipine (NORVASC) 10 MG tablet Take 1 tablet by mouth daily. 02/13/20 02/12/21  [provider]  Cyanocobalamin (VITAMIN B-12 PO) Take 2,500 mg by mouth daily.    [provider]  losartan-hydrochlorothiazide (HYZAAR) 50-12.5 MG tablet Take 1 tablet by mouth daily. 02/13/20 02/12/21  [provider]  metFORMIN (GLUCOPHAGE) 500 MG tablet Take 1 tablet by mouth daily with breakfast. 03/05/20 03/05/21  [provider]  Multiple Vitamin (MULTIVITAMIN) tablet Take 1 tablet by mouth daily.    [provider]  omeprazole (PRILOSEC) 40 MG capsule Take 1 capsule by mouth daily. 02/13/20 02/12/21  [provider]  sildenafil (VIAGRA) 100 MG tablet Take by mouth. 03/05/20 04/04/20  [provider]    Inpatient Medications: Scheduled Meds:  calcium chloride       calcium chloride       calcium gluconate  EPINEPHrine NaCl       pantoprazole sodium  40 mg Per Tube Daily   Continuous Infusions:  sodium chloride     sodium chloride     dextrose 5% lactated ringers     epinephrine     insulin     lactated ringers     norepinephrine     norepinephrine     norepinephrine (LEVOPHED) Adult infusion      sodium bicarbonate (isotonic) infusion in sterile water     PRN Meds: dextrose, docusate sodium, fentaNYL (SUBLIMAZE) injection, fentaNYL (SUBLIMAZE) injection, polyethylene glycol  Allergies:    No Known Allergies  Social History:   Social History    Socioeconomic History   Marital status: Single    Spouse name: Not on file   Number of children: Not on file   Years of education: Not on file   Highest education level: Not on file  Occupational History   Not on file  Tobacco Use   Smoking status: Every Day    Packs/day: 1.00    Years: 41.00    Pack years: 41.00    Types: Cigarettes   Smokeless tobacco: Never  Substance and Sexual Activity   Alcohol use: Not Currently    Comment: occasional light  beer   Drug use: Not on file   Sexual activity: Not on file  Other Topics Concern   Not on file  Social History Narrative   Not on file   Social Determinants of Health   Financial Resource Strain: Not on file  Food Insecurity: Not on file  Transportation Needs: Not on file  Physical Activity: Not on file  Stress: Not on file  Social Connections: Not on file  Intimate Partner Violence: Not on file     Family History:    Family History  Problem Relation Age of Onset   Diabetes Mother    Diabetes Father    Diabetes Sister      ROS:  All other ROS reviewed and negative. Pertinent positives noted in the HPI.     Physical Exam/Data:   Vitals:   11/04/2020 1430 11/08/2020 1443 11/03/2020 1445 11/19/2020 1545  BP: (!) 73/57  (!) 92/54 (!) 139/49  Pulse:  100  97  Resp: 20 (!) 22 (!) 22 (!) 23  SpO2:  90%  100%  Height:  6\' 2"  (1.88 m)     No intake or output data in the 24 hours ending 10/30/2020 1657  Last 3 Weights 03/18/2020  Weight (lbs) 313 lb 1.6 oz  Weight (kg) 142.021 kg    Body mass index is 40.2 kg/m.   General: Intubated on the vent Head: Atraumatic, normal size  Eyes: Pupils are dilated and nonreactive Neck: Supple, no JVD Endocrine: No thryomegaly Cardiac: Normal S1, S2; RRR; no murmurs, rubs, or gallops Lungs: Diminished breath sounds bilaterally Abd: Soft, nontender, no hepatomegaly  Ext: No edema, pulses 2+ Musculoskeletal: No deformities, BUE and BLE strength normal and equal Skin: Warm and dry,  no rashes   Neuro: Alert and oriented to person, place, time, and situation, CNII-XII grossly intact, no focal deficits  Psych: Normal mood and affect   EKG:  The EKG was personally reviewed and demonstrates: Sinus arrest versus atrial fibrillation with junctional escape Telemetry:  Telemetry was personally reviewed and demonstrates: Sinus rhythm heart in the 90s  Relevant CV Studies: Echo pending  Laboratory Data: High Sensitivity Troponin:  No results for input(s): TROPONINIHS in the last 720 hours.   Cardiac  EnzymesNo results for input(s): TROPONINI in the last 168 hours. No results for input(s): TROPIPOC in the last 168 hours.  Chemistry Recent Labs  Lab 11-28-2020 1402 2020-11-28 1405 11/28/2020 1406  NA 134* 134* 134*  K 4.4 4.2 4.2  CL 97* 100  --   CO2 16*  --   --   GLUCOSE 739* 667*  --   BUN 14 16  --   CREATININE 2.04* 1.80*  --   CALCIUM 11.6*  --   --   GFRNONAA 37*  --   --   ANIONGAP 21*  --   --     Recent Labs  Lab 11/28/2020 1402  PROT 5.1*  ALBUMIN 2.9*  AST 189*  ALT 127*  ALKPHOS 69  BILITOT 0.7   Hematology Recent Labs  Lab 11/28/2020 1402 11/28/20 1405 11-28-20 1406  WBC 9.7  --   --   RBC 4.08*  --   --   HGB 13.0 13.6 13.6  HCT 42.7 40.0 40.0  MCV 104.7*  --   --   MCH 31.9  --   --   MCHC 30.4  --   --   RDW 13.9  --   --   PLT 169  --   --    BNPNo results for input(s): BNP, PROBNP in the last 168 hours.  DDimer No results for input(s): DDIMER in the last 168 hours.  Radiology/Studies:  DG Chest Portable 1 View  Result Date: November 28, 2020 CLINICAL DATA:  Intubation, found unresponsive EXAM: PORTABLE CHEST 1 VIEW COMPARISON:  None. FINDINGS: Endotracheal tube is 3.8 cm above the carina. Enteric tube passes below the diaphragm with tip out of field of view. Lung volumes are low. There is patchy density bilaterally. No significant pleural effusion. No pneumothorax. Cardiomediastinal contours are likely within normal limits for technique.  IMPRESSION: Lines and tubes as above. Patchy density bilaterally with low lung volumes. May reflect atelectasis, mild edema, or pneumonia in the appropriate setting. No pleural effusion or pneumothorax. Electronically Signed   By: Guadlupe Spanish M.D.   On: 11-28-20 16:03    Assessment and Plan:   PEA Arrest/VT? -Mr. Jolliffe is reported to have been unresponsive after eating and taking pills.  He was down for roughly 30 minutes.  The initial rhythm per EMS reports was PEA arrest. -He is not intubated.  He is status post 50 minutes of resuscitative efforts.  Pupils are dilated and nonreactive.  He remains hypotensive with an acute kidney injury.  Other labs are pending. -He remains on pressors. -He is severely acidotic with a pH of 6.8.  I suspect this is related to possible DKA given his hyperglycemia.  Ongoing work-up as per the critical care medicine team. -It appears the inciting event was a PEA arrest.  This possibly was a respiratory arrest.  I did review his telemetry while here.  He shows significant bradycardia with widening of the QRS.  This is a metabolic rhythm.  It almost appears to be sinus node arrest.  His rhythm has improved and he now shows normal sinus rhythm.  I suspect his acidosis and other conditions are improving with supportive care.  There is mention of ventricular tachycardia however it is unclear if he had this.  There were chest compressions and it appears initial event was PEA arrest.  The acidosis could have clearly driven a VT episode after that.  He did not require any defibrillations and his rhythm has improved with management of his  acidosis and hypotension. -For now I would recommend supportive care.  I am repeating his EKG to make sure there is no ST elevation.  His initial ECG shows evidence of widening of QRS which is likely metabolic.  There is no indication go to the Cath Lab. -Given the length of resuscitative efforts it is unclear what neurological status he will  have.  This will determine how aggressive his cardiovascular work-up will be.  Cardiology will follow up his echocardiogram tomorrow. -I would not recommend amiodarone for now.  He has had no recurrence of any wide-complex tachycardia and his QRS is improved with treatment of his acidosis. -Cardiology will follow along while here  2.  Junctional rhythm/sinus node arrest/bradycardia -I suspect this was all metabolically driven.  Initial pH 6.8.  He now has sinus rhythm with heart rate in the 90s.  I suspect his acidosis is improving. -We will continue with supportive care  3.  Ventricular tachycardia? -Really unclear if he had VT.  Review of telemetry is marked artifact from chest compressions.  Nonetheless he has had no further episodes.  Would recommend supportive care.  If he did have VT it was likely driven by acidosis.  The initial inciting event was a PEA arrest.  For questions or updates, please contact [facility] Please consult [email or web address] for contact info under   Signed, Gerri Spore T. Flora Lipps, MD, [facility]  [facility]  10/23/2020 4:57 PM

## 2020-11-11 NOTE — Procedures (Signed)
Arterial Catheter Insertion Procedure Note  Shane Caldwell  [id number]  1960-08-13  Date:Dec 09, 2020  Time:6:43 PM    Provider Performing: Eliezer Champagne    Procedure: Insertion of Arterial Line (34193) with US guidance (79024)   Indication(s) Blood pressure monitoring and/or need for frequent ABGs  Consent Risks of the procedure as well as the alternatives and risks of each were explained to the patient and/or caregiver.  Consent for the procedure was obtained and is signed in the bedside chart  Anesthesia None   Time Out Verified patient identification, verified procedure, site/side was marked, verified correct patient position, special equipment/implants available, medications/allergies/relevant history reviewed, required imaging and test results available.   Sterile Technique Maximal sterile technique including full sterile barrier drape, hand hygiene, sterile gown, sterile gloves, mask, hair covering, sterile ultrasound probe cover (if used).   Procedure Description Area of catheter insertion was cleaned with chlorhexidine and draped in sterile fashion. With real-time ultrasound guidance an arterial catheter was placed into the right femoral artery.  Appropriate arterial tracings confirmed on monitor.     Complications/Tolerance None; patient tolerated the procedure well.   EBL Minimal   Specimen(s) None Shane Caldwell., MSN, APRN, AGACNP-BC [facility]  09-Dec-2020 , 6:43 PM  Please see [email or web address] for pager details  If no response, please call [phone] After hours, please call Elink at [phone]

## 2020-11-11 NOTE — ED Notes (Addendum)
Pt was O2sat 88% called RT. RT recommended elevating HOB. If not maintaining at 88% or higher call back. Pt pulse ox cord checked and various sites changed. O2sat reading 83. RT in route to room.

## 2020-11-11 NOTE — Progress Notes (Signed)
PCCM INTERVAL PROGRESS NOTE  Called to bedside by EMD for patient refractory shock  Briefly this 60 year old male who was admitted status post cardiac arrest of approximately 50 to 60 minutes.  Patient has remained unresponsive without any sedation.  He is intubated on maximum ventilatory settings.  He is on norepinephrine and epinephrine infusions at doses higher than the typical maximum dose as well as shock dose vasopressin.  He is on a bicarbonate infusion.  Despite these therapies he continues to have refractory metabolic and respiratory acidosis and refractory shock.  On exam patient is comatose with ventilator assisted breaths.  Clear bilateral breath sounds.  Normal sinus rhythm on the monitor.  Abdomen is firm and distended.  CT scan has not been obtained due to patient instability.  CT scan of the head has not been done for the same reason.  ABG with mixed acidosis worsening despite vent adjustment.  Troponin resulted at almost 8000.   Cardiac arrest: Etiology unclear, however, with troponin coming back greater than 8000 would increase suspicion for primary cardiac event.  Start heparin infusion.  Not stable enough at this point for cardiology evaluation.  Alternatively could have suffered abdominal catastrophe with from distended abdomen, however, unstable for CT evaluation.  Will broaden to Zosyn  Acute encephalopathy: Likely anoxic/metabolic in the setting of prolonged cardiac arrest and now with refractory shock refractory acidosis. -Supportive care at this point -CT head if stabilizes.  -Avoid fever   Family is bedside and have been updated. I informed them it is unlikely he will survive this event. They are understandably shaken by this news need time to come to terms. Will require ongoing goals of care discussion.   Additional critical care time 40 minutes  Joneen Roach, AGACNP-BC [facility]  See Amion for personal pager PCCM on call pager [phone] until 7pm. Please call Elink 7p-7a. [phone]  10/28/2020 11:47 PM

## 2020-11-11 NOTE — ED Notes (Signed)
Provider at the bedside. Order to give another bicarb amp and bicarb drip started.

## 2020-11-11 NOTE — Code Documentation (Addendum)
Pulse check. Compressions resumed.

## 2020-11-11 NOTE — Code Documentation (Signed)
5 units of insulin given

## 2020-11-11 NOTE — H&P (Signed)
NAME:  Ernest Popowski, MRN:  [id number], DOB:  1960/08/22, LOS: 0 ADMISSION DATE:  November 19, 2020, CONSULTATION DATE:  11/19/2020  REFERRING MD:  ED, Adela Lank, CHIEF COMPLAINT:  cardiac arrest   History of Present Illness:  Obtained from fianc Kim and review of ED records 60 year old, morbidly obese diabetic, hypertensive, developed unresponsiveness after they had something to eat and he took his pills.  Neysa Bonito took a few minutes to try to revive him before calling EMS he had snoring respirations.   On EMS arrival, found in PEA , 7 rounds of epi, downtime more than 30 minutes , arrived on LUCAS with VT, downtime additional 20 minutes in ED prior to ROSC.  Required Levophed and epinephrine drips, PCCM consulted  Pertinent  Medical History  Diabetes Hypertension  Significant Hospital Events: Including procedures, antibiotic start and stop dates in addition to other pertinent events   6/20 cardiac arrest -exam post ROSC consistent with extensive anoxic injury  Interim History / Subjective:  Unresponsive, on vent  Objective   Blood pressure (!) 139/49, pulse 97, resp. rate (!) 23, height 6\' 2"  (1.88 m), SpO2 100 %.    Vent Mode: PRVC FiO2 (%):  [100 %] 100 % Set Rate:  [22 bmp] 22 bmp Vt Set:  mL] 660 mL PEEP:  [10 cmH20] 10 cmH20 Plateau Pressure:  [27 cmH20] 27 cmH20  No intake or output data in the 24 hours ending November 19, 2020 1612 There were no vitals filed for this visit.  Examination: General: Obese middle-age man, intubated, no distress HENT: Pupils midpoint 3 mm nonreactive to light, no JVD, short thick neck Lungs: Decreased breath sounds bilateral Cardiovascular: S1-S2 regular, sinus on monitor Abdomen: Soft protuberant, bowel sounds absent Extremities: Cool, no deformity Neuro: GCS 3, comatose, no sedation given for intubation, corneal reflexes absent, doll's eye absent, no gag reflex   Labs/imaging that I havepersonally reviewed  (right click and "Reselect all SmartList  Selections" daily)  Chest x-ray independently reviewed shows ET tube in position,  OG tube was advanced and in position, bibasal infiltrates  Venous ABG shows metabolic and respiratory acidosis. Labs show hyperglycemia, anion gap of 21 , no leukocytosis  Resolved Hospital Problem list     Assessment & Plan:  Cardiac arrest of unknown etiology based on history, it seems that there was primary loss of consciousness followed by cardiac arrest or could be a primary neurological event We will proceed with head CT stat to clarify  Shock, postcardiac arrest -estimated downtime more than 60 minutes -Not a candidate for hypothermia -Obtain echo -Resuscitate with pressors including Levophed and epinephrine, will place central line and arterial line to facilitate hemodynamic monitoring and treatment  Acute encephalopathy -exam concerning for post anoxic injury. Head CT stat Avoid benzodiazepines, use fentanyl as needed, goal RASS 0  Acute respiratory failure -vent settings reviewed and adjusted, increased respiratory rate to 26, low tidal volume to 620, obtain ABG to clarify  Anion gap metabolic acidosis -could be lactate and possibly DKA Await lactate and beta hydroxybutyrate to clarify Start insulin drip meantime, IV fluids normal saline at 100/hour to transition to dextrose containing fluids once CBG less than 250  AKI - Unknown baseline creatinine, obtain serial B mets Start bicarbonate drip  Best Practice (right click and "Reselect all SmartList Selections" daily)   Diet/type: NPO Pain/Anxiety/Delirium protocol RASS goal: 0 VAP protocol (if indicated): Yes DVT prophylaxis: SCD GI prophylaxis: PPI Glucose control:  insulin gtt.  Central venous access:  Yes, and it is still  needed Arterial line:  Yes, and it is still needed Foley:  Yes, and it is still needed Mobility:  bed rest  PT consulted: Yes Code Status:  full code Last date of multidisciplinary goals of care discussion  []  updated to current condition, full code , guarded prognosis given for neurologic recovery Disposition: admit to the intensive care        Labs   CBC: Recent Labs  Lab 10/27/2020 1402 10/24/2020 1405 11/20/2020 1406  WBC 9.7  --   --   NEUTROABS 3.6  --   --   HGB 13.0 13.6 13.6  HCT 42.7 40.0 40.0  MCV 104.7*  --   --   PLT 169  --   --     Basic Metabolic Panel: Recent Labs  Lab 11/06/2020 1402 11/15/2020 1405 10/28/2020 1406  NA 134* 134* 134*  K 4.4 4.2 4.2  CL 97* 100  --   CO2 16*  --   --   GLUCOSE 739* 667*  --   BUN 14 16  --   CREATININE 2.04* 1.80*  --   CALCIUM 11.6*  --   --    GFR: CrCl cannot be calculated (Unknown ideal weight.). Recent Labs  Lab 10/29/2020 1402  WBC 9.7    Liver Function Tests: Recent Labs  Lab 11/10/2020 1402  AST 189*  ALT 127*  ALKPHOS 69  BILITOT 0.7  PROT 5.1*  ALBUMIN 2.9*   No results for input(s): LIPASE, AMYLASE in the last 168 hours. No results for input(s): AMMONIA in the last 168 hours.  ABG    Component Value Date/Time   HCO3 13.7 (L) 10/31/2020 1406   TCO2 16 (L) 10/28/2020 1406   ACIDBASEDEF 22.0 (H) 11/17/2020 1406   O2SAT 45.0 11/03/2020 1406     Coagulation Profile: No results for input(s): INR, PROTIME in the last 168 hours.  Cardiac Enzymes: No results for input(s): CKTOTAL, CKMB, CKMBINDEX, TROPONINI in the last 168 hours.  HbA1C: No results found for: HGBA1C  CBG: Recent Labs  Lab 10/25/2020 1400  GLUCAP >600*    Review of Systems:   Unable to obtain since unresponsive and intubated  Past Medical History:  He,  has a past medical history of Diabetes mellitus without complication (HCC), GERD (gastroesophageal reflux disease), and Hypertension.   Surgical History:  No past surgical history on file.   Social History:   reports that he has been smoking cigarettes. He has a 41.00 pack-year smoking history. He has never used smokeless tobacco. He reports previous alcohol use.    Family History:  His family history includes Diabetes in his father, mother, and sister.   Allergies No Known Allergies   Home Medications  Prior to Admission medications   Medication Sig Start Date End Date Taking? Authorizing Provider  amLODipine (NORVASC) 10 MG tablet Take 1 tablet by mouth daily. 02/13/20 02/12/21  [provider]  Cyanocobalamin (VITAMIN B-12 PO) Take 2,500 mg by mouth daily.    [provider]  losartan-hydrochlorothiazide (HYZAAR) 50-12.5 MG tablet Take 1 tablet by mouth daily. 02/13/20 02/12/21  [provider]  metFORMIN (GLUCOPHAGE) 500 MG tablet Take 1 tablet by mouth daily with breakfast. 03/05/20 03/05/21  [provider]  Multiple Vitamin (MULTIVITAMIN) tablet Take 1 tablet by mouth daily.    [provider]  omeprazole (PRILOSEC) 40 MG capsule Take 1 capsule by mouth daily. 02/13/20 02/12/21  [provider]  sildenafil (VIAGRA) 100 MG tablet Take by mouth.  03/05/20 04/04/20  [provider]     Critical care time: 57m independent of procedures      Cyril Mourning MD. FCCP. [facility] Pager : [phone]  If no response to pager , please call [phone] until 7 pm After 7:00 pm call Elink  [phone]   11/17/2020

## 2020-11-11 NOTE — ED Notes (Signed)
Notified provider of critical values. Awaiting next steps.

## 2020-11-11 NOTE — Procedures (Signed)
Central Venous Catheter Insertion Procedure Note  Kenard Morawski  [id number]  [birth year]  Date:11/17/2020  Time:4:42 PM   Provider Performing:Kei Langhorst Hezzie Bump   Procedure: Insertion of Non-tunneled Central Venous [phone]) with US guidance (21224)   Indication(s) Medication administration  Consent Risks of the procedure as well as the alternatives and risks of each were explained to the patient and/or caregiver.  Consent for the procedure was obtained and is signed in the bedside chart  Anesthesia Topical only with 1% lidocaine   Timeout Verified patient identification, verified procedure, site/side was marked, verified correct patient position, special equipment/implants available, medications/allergies/relevant history reviewed, required imaging and test results available.  Sterile Technique Maximal sterile technique including full sterile barrier drape, hand hygiene, sterile gown, sterile gloves, mask, hair covering, sterile ultrasound probe cover (if used).  Procedure Description Area of catheter insertion was cleaned with chlorhexidine and draped in sterile fashion.  With real-time ultrasound guidance a central venous catheter was placed into the right internal jugular vein. Nonpulsatile blood flow and easy flushing noted in all ports.  The catheter was sutured in place and sterile dressing applied.     Complications/Tolerance None; patient tolerated the procedure well. Chest X-ray is ordered to verify placement for internal jugular or subclavian cannulation.   Chest x-ray is not ordered for femoral cannulation.  EBL Minimal  Specimen(s) None  Gershon Mussel., MSN, APRN, AGACNP-BC [facility]  11/11/2020 , 4:43 PM  Please see [email or web address] for pager details  If no response, please call [phone] After hours, please call Elink at [phone]

## 2020-11-11 NOTE — Code Documentation (Signed)
Epi drip started at 20 mcg/min

## 2020-11-11 NOTE — ED Notes (Signed)
Attempted to give reportx1 

## 2020-11-11 NOTE — Code Documentation (Signed)
Pulse check. Compressions resumed.

## 2020-11-11 NOTE — ED Notes (Signed)
Provider adjusted ventilator settings.

## 2020-11-11 NOTE — ED Triage Notes (Signed)
BIB [facility] after pt fiance called to report pt unresponsive at home after c/o of being hot. Per EMS, pt was found down/unresponsive on scene for 30 mins. Pt in PEA, 7 rounds of epi given. Pt arrived on LUCAS. pt VT w/out pulse upon arrival. Code started. MD and RT at bedside.

## 2020-11-11 NOTE — ED Notes (Signed)
Provider at bedside placing arterial line.

## 2020-11-11 NOTE — Progress Notes (Signed)
ANTICOAGULATION CONSULT NOTE - Initial Consult  Pharmacy Consult for Heparin Indication: chest pain/ACS  No Known Allergies  Patient Measurements: Height: 6\' 2"  (188 cm) Weight: (!) 144.2 kg (317 lb 14.5 oz) IBW/kg (Calculated) : 82.2 Heparin Dosing Weight: 115 kg  Vital Signs: Temp: 97.7 F (36.5 C) (06/20 2140) Temp Source: Oral (06/20 2140) BP: 85/43 (06/20 2351) Pulse Rate: 90 (06/20 2300)  Labs: Recent Labs    2020-11-20 1402 2020-11-20 1405 11-20-2020 1406 11/20/20 1712 20-Nov-2020 1736 11-20-2020 2029 November 20, 2020 2146 11/20/20 2200  HGB 13.0 13.6   < > 15.0  --  13.9  --  15.0  HCT 42.7 40.0   < > 44.0  --  41.0  --  44.0  PLT 169  --   --   --   --   --   --   --   CREATININE 2.04* 1.80*  --   --  2.64*  --  3.53*  --   TROPONINIHS  --   --   --   --  11/13/20*  --  7,896*  --    < > = values in this interval not displayed.    Estimated Creatinine Clearance: 34.1 mL/min (A) (by C-G formula based on SCr of 3.53 mg/dL (H)).   Medical History: Past Medical History:  Diagnosis Date   Diabetes mellitus without complication (HCC)    GERD (gastroesophageal reflux disease)    Hypertension     Medications:  Medications Prior to Admission  Medication Sig Dispense Refill Last Dose   amLODipine (NORVASC) 10 MG tablet Take 10 mg by mouth daily.      Cyanocobalamin (VITAMIN B-12 PO) Take 2,500 mg by mouth daily.      losartan-hydrochlorothiazide (HYZAAR) 50-12.5 MG tablet Take 1 tablet by mouth daily.      metFORMIN (GLUCOPHAGE) 500 MG tablet Take 500 mg by mouth daily with breakfast.      Multiple Vitamin (MULTIVITAMIN) tablet Take 1 tablet by mouth daily.      omeprazole (PRILOSEC) 40 MG capsule Take 40 mg by mouth daily.      sildenafil (VIAGRA) 100 MG tablet Take 100 mg by mouth daily as needed for erectile dysfunction.       Assessment: 60 y.o. male admitted s/p cardiac arrest, elevated troponin and possible ACS, for heparin  Goal of Therapy:  Heparin level 0.3-0.7  units/ml Monitor platelets by anticoagulation protocol: Yes   Plan:  Heparin 4000 units IV bolus, then start heparin 1700 units/hr Check heparin level in 8 hours.   46 11-20-20,11:59 PM

## 2020-11-11 NOTE — ED Notes (Signed)
Pt O2 89% contacted RT. Pt is in trendelenburg position. O2 at 91%

## 2020-11-12 ENCOUNTER — Other Ambulatory Visit (HOSPITAL_COMMUNITY): Payer: Self-pay

## 2020-11-12 ENCOUNTER — Inpatient Hospital Stay (HOSPITAL_COMMUNITY): Payer: 59

## 2020-11-12 DIAGNOSIS — N179 Acute kidney failure, unspecified: Secondary | ICD-10-CM

## 2020-11-12 DIAGNOSIS — G936 Cerebral edema: Secondary | ICD-10-CM

## 2020-11-12 DIAGNOSIS — G931 Anoxic brain damage, not elsewhere classified: Secondary | ICD-10-CM

## 2020-11-12 DIAGNOSIS — R570 Cardiogenic shock: Secondary | ICD-10-CM

## 2020-11-12 DIAGNOSIS — R0902 Hypoxemia: Secondary | ICD-10-CM

## 2020-11-12 LAB — GLUCOSE, CAPILLARY
Glucose-Capillary: 273 mg/dL — ABNORMAL HIGH (ref 70–99)
Glucose-Capillary: 300 mg/dL — ABNORMAL HIGH (ref 70–99)
Glucose-Capillary: 330 mg/dL — ABNORMAL HIGH (ref 70–99)
Glucose-Capillary: 377 mg/dL — ABNORMAL HIGH (ref 70–99)
Glucose-Capillary: 399 mg/dL — ABNORMAL HIGH (ref 70–99)
Glucose-Capillary: 435 mg/dL — ABNORMAL HIGH (ref 70–99)
Glucose-Capillary: 436 mg/dL — ABNORMAL HIGH (ref 70–99)
Glucose-Capillary: 510 mg/dL (ref 70–99)
Glucose-Capillary: 518 mg/dL (ref 70–99)
Glucose-Capillary: 561 mg/dL (ref 70–99)

## 2020-11-12 LAB — POCT I-STAT 7, (LYTES, BLD GAS, ICA,H+H)
Acid-base deficit: 15 mmol/L — ABNORMAL HIGH (ref 0.0–2.0)
Acid-base deficit: 15 mmol/L — ABNORMAL HIGH (ref 0.0–2.0)
Bicarbonate: 17.1 mmol/L — ABNORMAL LOW (ref 20.0–28.0)
Bicarbonate: 16.9 mmol/L — ABNORMAL LOW (ref 20.0–28.0)
Calcium, Ion: 1.36 mmol/L (ref 1.15–1.40)
Calcium, Ion: 1.31 mmol/L (ref 1.15–1.40)
HCT: 42 % (ref 39.0–52.0)
HCT: 41 % (ref 39.0–52.0)
Hemoglobin: 14.3 g/dL (ref 13.0–17.0)
Hemoglobin: 13.9 g/dL (ref 13.0–17.0)
O2 Saturation: 63 %
O2 Saturation: 50 %
Patient temperature: 96.4
Patient temperature: 93.8
Potassium: 2.6 mmol/L — CL (ref 3.5–5.1)
Potassium: 2.9 mmol/L — ABNORMAL LOW (ref 3.5–5.1)
Sodium: 139 mmol/L (ref 135–145)
Sodium: 140 mmol/L (ref 135–145)
TCO2: 19 mmol/L — ABNORMAL LOW (ref 22–32)
TCO2: 19 mmol/L — ABNORMAL LOW (ref 22–32)
pCO2 arterial: 61.7 mmHg — ABNORMAL HIGH (ref 32.0–48.0)
pCO2 arterial: 59 mmHg — ABNORMAL HIGH (ref 32.0–48.0)
pH, Arterial: 7.043 — CL (ref 7.350–7.450)
pH, Arterial: 7.047 — CL (ref 7.350–7.450)
pO2, Arterial: 44 mmHg — ABNORMAL LOW (ref 83.0–108.0)
pO2, Arterial: 33 mmHg — CL (ref 83.0–108.0)

## 2020-11-12 LAB — CBC
HCT: 42.7 % (ref 39.0–52.0)
Hemoglobin: 13.7 g/dL (ref 13.0–17.0)
MCH: 31.7 pg (ref 26.0–34.0)
MCHC: 32.1 g/dL (ref 30.0–36.0)
MCV: 98.8 fL (ref 80.0–100.0)
Platelets: 179 K/uL (ref 150–400)
RBC: 4.32 MIL/uL (ref 4.22–5.81)
RDW: 14 % (ref 11.5–15.5)
WBC: 10.8 K/uL — ABNORMAL HIGH (ref 4.0–10.5)
nRBC: 2.1 % — ABNORMAL HIGH (ref 0.0–0.2)

## 2020-11-12 LAB — BASIC METABOLIC PANEL
Anion gap: 24 — ABNORMAL HIGH (ref 5–15)
BUN: 19 mg/dL (ref 6–20)
CO2: 15 mmol/L — ABNORMAL LOW (ref 22–32)
Calcium: 9.4 mg/dL (ref 8.9–10.3)
Chloride: 101 mmol/L (ref 98–111)
Creatinine, Ser: 4.19 mg/dL — ABNORMAL HIGH (ref 0.61–1.24)
GFR, Estimated: 16 mL/min — ABNORMAL LOW (ref >60)
Glucose, Bld: 419 mg/dL — ABNORMAL HIGH (ref 70–99)
Potassium: 2.8 mmol/L — ABNORMAL LOW (ref 3.5–5.1)
Sodium: 140 mmol/L (ref 135–145)

## 2020-11-12 LAB — OCCULT BLOOD X 1 CARD TO LAB, STOOL: Fecal Occult Bld: POSITIVE — AB

## 2020-11-12 LAB — PHOSPHORUS: Phosphorus: 2.9 mg/dL (ref 2.5–4.6)

## 2020-11-12 LAB — MRSA NEXT GEN BY PCR, NASAL: MRSA by PCR Next Gen: NOT DETECTED

## 2020-11-12 LAB — MAGNESIUM: Magnesium: 2.2 mg/dL (ref 1.7–2.4)

## 2020-11-12 MED ORDER — HEPARIN (PORCINE) 25000 UT/250ML-% IV SOLN
1700.0000 [IU]/h | INTRAVENOUS | Status: DC
  Administered 2020-11-12: 1700 [IU]/h via INTRAVENOUS
  Filled 2020-11-12: qty 250

## 2020-11-12 MED ORDER — PIPERACILLIN-TAZOBACTAM 3.375 G IVPB
3.3750 g | Freq: Three times a day (TID) | INTRAVENOUS | Status: DC
  Filled 2020-11-12: qty 50

## 2020-11-12 MED ORDER — HEPARIN BOLUS VIA INFUSION
4000.0000 [IU] | Freq: Once | INTRAVENOUS | Status: AC
  Administered 2020-11-12: 4000 [IU] via INTRAVENOUS
  Filled 2020-11-12: qty 4000

## 2020-11-12 MED ORDER — PHENYLEPHRINE HCL-NACL 10-0.9 MG/250ML-% IV SOLN: 0.0000 ug/min | INTRAVENOUS | Status: DC

## 2020-11-12 MED ORDER — PHENYLEPHRINE CONCENTRATED 100MG/250ML (0.4 MG/ML) INFUSION SIMPLE
0.0000 ug/min | INTRAVENOUS | Status: DC
  Administered 2020-11-12: 40 ug/min via INTRAVENOUS
  Filled 2020-11-12 (×2): qty 250

## 2020-11-12 MED ORDER — PHENYLEPHRINE HCL-NACL 10-0.9 MG/250ML-% IV SOLN
INTRAVENOUS | Status: AC
  Filled 2020-11-12: qty 250

## 2020-11-12 MED ORDER — PIPERACILLIN-TAZOBACTAM 3.375 G IVPB: 3.3750 g | Freq: Three times a day (TID) | INTRAVENOUS | Status: DC

## 2020-11-12 MED ORDER — ATROPINE SULFATE 1 MG/10ML IJ SOSY
PREFILLED_SYRINGE | INTRAMUSCULAR | Status: AC
  Filled 2020-11-12: qty 10

## 2020-11-12 MED ORDER — SODIUM CHLORIDE 0.9 % IV SOLN
0.5000 ug/min | INTRAVENOUS | Status: DC
  Administered 2020-11-12: 20 ug/min via INTRAVENOUS
  Filled 2020-11-12 (×2): qty 10

## 2020-11-12 MED ORDER — POTASSIUM CHLORIDE 10 MEQ/50ML IV SOLN
10.0000 meq | INTRAVENOUS | Status: DC
  Administered 2020-11-12 (×2): 10 meq via INTRAVENOUS
  Filled 2020-11-12 (×2): qty 50

## 2020-11-12 MED ORDER — EPINEPHRINE 1 MG/10ML IJ SOSY
PREFILLED_SYRINGE | INTRAMUSCULAR | Status: AC
  Filled 2020-11-12: qty 10

## 2020-11-13 LAB — HEMOGLOBIN A1C
Hgb A1c MFr Bld: 10.6 % — ABNORMAL HIGH (ref 4.8–5.6)
Mean Plasma Glucose: 258 mg/dL

## 2020-11-22 NOTE — Progress Notes (Addendum)
eLink Physician-Brief Progress Note Patient Name: Shane Caldwell DOB: 09-May-1961 MRN: [id number]   Date of Service  10/27/2020  HPI/Events of Note  Hypotensive on 3 pressors, Levo, Epi and Vaso  eICU Interventions  Will add phenylephrine     Intervention Category Major Interventions: Hypotension - evaluation and management  Darl Pikes 10/27/2020, 5:15 AM

## 2020-11-22 NOTE — Progress Notes (Signed)
Critical ABG results called to Elink.  No further ordered at this time.   pH 7.4 CO2 59 pO2 33 HCO3 16

## 2020-11-22 NOTE — Death Summary Note (Signed)
   NAME:  Shane Caldwell, MRN:  [id number], DOB:  06/05/1960, LOS: 1 ADMISSION DATE:  11/21/2020, CONSULTATION DATE:  12-08-2020  REFERRING MD:  ED, Adela Lank, CHIEF COMPLAINT:  cardiac arrest   History of Present Illness:  Obtained from fianc Kim and review of ED records 60 year old, morbidly obese diabetic, hypertensive, complained of "feeling hot" developed unresponsiveness after they had something to eat and he took his pills.  Neysa Bonito took a few minutes to try to revive him before calling EMS he had snoring respirations.   On EMS arrival, found in PEA , 7 rounds of epi, downtime more than 30 minutes , arrived on LUCAS with VT, downtime additional 20 minutes in ED prior to ROSC.  Required Levophed and epinephrine drips, PCCM consulted  Pertinent  Medical History  Diabetes Hypertension  Significant Hospital Events: Including procedures, antibiotic start and stop dates in addition to other pertinent events   6/20 cardiac arrest -exam post ROSC consistent with extensive anoxic injury Head CT shows diffuse cerebral edema CT abdomen/pelvis shows diffuse gaseous distention of large and small bowel loops, small bowel pneumatosis with diffuse portal venous gas, bilateral lower lobe airspace consolidation  Assessment & Plan:  Cardiac arrest of unknown etiology based on history, it seems that there was primary loss of consciousness followed by cardiac arrest , but head CT does not suggest primary neurologic event.  Elevated troponin could be postarrest or could indicate a primary cardiac event.  At any rate he is in refractory shock, lactic acidosis likely related to bowel ischemia and ATN.  Head CT shows diffuse cerebral edema and exam is consistent with brain death  Shock, postcardiac arrest -estimated downtime more than 60 minutes -On maximum doses of 4 pressors  Acute encephalopathy -exam concerning for post anoxic injury. Head CT shows acute cerebral edema , by exam meets criteria for brain death.   Unable to perform apnea testing due to hemodynamic instability   Acute respiratory failure -vent settings reviewed and adjusted,  -no spontaneous respirations  Anion gap metabolic acidosis -persistent lactic acidosis now related to bowel ischemia Continue insulin drip, doubt bicarb of any benefit now  AKI - Unknown baseline creatinine No urine to send for UDS   COURSE : He developed progressive shock refractory to 4 pressors.  Exam was consistent with brain death Marybelle Killings and Sister Darlene at bedside updated to concept of brain death.  No CPR no cardioversion issued.  He passed away soon after. Medical examiner called.  Cause of death -acute coronary syndrome, metabolic acidosis , multiorgan failure   Alyric Parkin V. Vassie Loll MD

## 2020-11-22 NOTE — Progress Notes (Signed)
eLink Physician-Brief Progress Note Patient Name: Shane Caldwell DOB: 28-Oct-1960 MRN: [id number]   Date of Service  12-04-2020  HPI/Events of Note  Discussed with bedside nurse to proceed with CT scan despite instability to help [place] prognosticate as well as investigate possible cause of arrest.   eICU Interventions  CT scan reviewed appears to have cerebral edema consistent with anoxic injury. Dilated bowel without free air. Will await official read.     Intervention Category Intermediate Interventions: Diagnostic test evaluation  Darl Pikes 12-04-20, 2:49 AM

## 2020-11-22 NOTE — Progress Notes (Signed)
Chaplain was called to patient's room to offer emotional and spiritual support to family as patient has been placed on comfort care and will be extubated shortly. Patient's fiancee was bedside and his sister and brother-in-law were in room. Patient offered prayer and emotional support to family. Comfort tray delivered to room. Chaplain is available when needed.    11/02/2020 0900  Clinical Encounter Type  Visited With Patient and family together  Visit Type Critical Care  Referral From Nurse  Consult/Referral To Chaplain  Spiritual Encounters  Spiritual Needs Prayer;Emotional;Grief support  Stress Factors  Family Stress Factors Loss

## 2020-11-22 NOTE — Progress Notes (Signed)
Patient transported to CT scan and back to 3M14 without incidence.

## 2020-11-22 NOTE — Progress Notes (Signed)
NAME:  Shane Caldwell, MRN:  [id number], DOB:  [birth year], LOS: 1 ADMISSION DATE:  10/31/2020, CONSULTATION DATE:  December 03, 2020  REFERRING MD:  ED, Adela Lank, CHIEF COMPLAINT:  cardiac arrest   History of Present Illness:  Obtained from fianc Kim and review of ED records 60 year old, morbidly obese diabetic, hypertensive, complained of "feeling hot" developed unresponsiveness after they had something to eat and he took his pills.  Neysa Bonito took a few minutes to try to revive him before calling EMS he had snoring respirations.   On EMS arrival, found in PEA , 7 rounds of epi, downtime more than 30 minutes , arrived on LUCAS with VT, downtime additional 20 minutes in ED prior to ROSC.  Required Levophed and epinephrine drips, PCCM consulted  Pertinent  Medical History  Diabetes Hypertension  Significant Hospital Events: Including procedures, antibiotic start and stop dates in addition to other pertinent events   6/20 cardiac arrest -exam post ROSC consistent with extensive anoxic injury Head CT shows diffuse cerebral edema CT abdomen/pelvis shows diffuse gaseous distention of large and small bowel loops, small bowel pneumatosis with diffuse portal venous gas, bilateral lower lobe airspace consolidation  Interim History / Subjective:   Critically ill, remained hypotensive overnight, Neo-Synephrine and vasopressin added. Remains acidotic. On no sedation since admission  Objective   Blood pressure (!) 77/35, pulse 71, temperature (!) 96.6 F (35.9 C), temperature source Axillary, resp. rate (!) 30, height 6\' 2"  (1.88 m), weight (!) 144.2 kg, SpO2 (!) 78 %. CVP:  [10 mmHg] 10 mmHg  Vent Mode: PRVC FiO2 (%):  [100 %] 100 % Set Rate:  [22 bmp-30 bmp] 30 bmp Vt Set:  [600 mL-660 mL] 600 mL PEEP:  [10 cmH20] 10 cmH20 Plateau Pressure:  [21 cmH20-34 cmH20] 33 cmH20   Intake/Output Summary (Last 24 hours) at 12/03/2020 11/14/2020 Last data filed at 12/03/20 0800 Gross per 24 hour  Intake 9120.76 ml   Output --  Net 9120.76 ml   Filed Weights   11/07/2020 1800 11/14/2020 2140 12/03/2020 0500  Weight: (!) 142 kg (!) 144.2 kg (!) 144.2 kg    Examination: General: Obese middle-age man, intubated, no distress HENT: Pupils dilated nonreactive to light, no JVD, short thick neck Lungs: Decreased breath sounds bilateral Cardiovascular: S1-S2 regular, sinus on monitor Abdomen: Distended, absent bowel sounds Extremities: Cool, no deformity Neuro: GCS 3, comatose, pupils dilated not reactive, absent corneal and conjunctival reflex, no gag reflex, doll's eye absent, no spontaneous respiration when vent rate turned down to 10  Labs/imaging that I havepersonally reviewed  (right click and "Reselect all SmartList Selections" daily)  Chest x-ray independently reviewed shows lines and tubes in position, low lung volumes, bibasilar infiltrates/atelectasis  Labs show hypokalemia, hyperglycemia, stable hemoglobin, no leukocytosis ABG shows persistent respiratory and metabolic acidosis  Resolved Hospital Problem list     Assessment & Plan:  Cardiac arrest of unknown etiology based on history, it seems that there was primary loss of consciousness followed by cardiac arrest , but head CT does not suggest primary neurologic event.  Elevated troponin could be postarrest or could indicate a primary cardiac event.  At any rate he is in refractory shock, lactic acidosis likely related to bowel ischemia and ATN.  Head CT shows diffuse cerebral edema and exam is consistent with brain death  Shock, postcardiac arrest -estimated downtime more than 60 minutes -On maximum doses of 4 pressors  Acute encephalopathy -exam concerning for post anoxic injury. Head CT shows acute cerebral edema , by exam  meets criteria for brain death.  Unable to perform apnea testing due to hemodynamic instability   Acute respiratory failure -vent settings reviewed and adjusted,  -no spontaneous respirations  Anion gap metabolic  acidosis -persistent lactic acidosis now related to bowel ischemia Continue insulin drip, doubt bicarb of any benefit now  AKI - Unknown baseline creatinine No urine to send for UDS    Best Practice (right click and "Reselect all SmartList Selections" daily)   CODE STATUS -limited code issued Last date of multidisciplinary goals of care discussion []  and Sister Darlene at bedside updated to concept of brain death.  No CPR no cardioversion.  Awaiting on them to make a decision for withdraw life support versus allow natural death        Labs   CBC: Recent Labs  Lab 2020/12/08 1402 December 08, 2020 1405 12-08-20 2029 2020/12/08 2200 11/07/2020 0129 10/26/2020 0419 10/25/2020 0518  WBC 9.7  --   --   --   --  10.8*  --   NEUTROABS 3.6  --   --   --   --   --   --   HGB 13.0   < > 13.9 15.0 14.3 13.7 13.9  HCT 42.7   < > 41.0 44.0 42.0 42.7 41.0  MCV 104.7*  --   --   --   --  98.8  --   PLT 169  --   --   --   --  179  --    < > = values in this interval not displayed.     Basic Metabolic Panel: Recent Labs  Lab 12/08/20 1402 08-Dec-2020 1405 2020-12-08 1406 12-08-2020 1736 December 08, 2020 2029 08-Dec-2020 2146 December 08, 2020 2200 11/11/2020 0129 11/05/2020 0419 11/19/2020 0518  NA 134* 134*   < > 135   < > 133* 135 139 140 140  K 4.4 4.2   < > 4.0   < > 3.3* 3.3* 2.6* 2.8* 2.9*  CL 97* 100  --  100  --  97*  --   --  101  --   CO2 16*  --   --  17*  --  15*  --   --  15*  --   GLUCOSE 739* 667*  --  669*  --  710*  --   --  419*  --   BUN 14 16  --  15  --  19  --   --  19  --   CREATININE 2.04* 1.80*  --  2.64*  --  3.53*  --   --  4.19*  --   CALCIUM 11.6*  --   --  10.5*  --  9.7  --   --  9.4  --   MG  --   --   --   --   --   --   --   --  2.2  --   PHOS  --   --   --   --   --   --   --   --  2.9  --    < > = values in this interval not displayed.    GFR: Estimated Creatinine Clearance: 28.7 mL/min (A) (by C-G formula based on SCr of 4.19 mg/dL (H)). Recent Labs  Lab  12/08/20 1402 2020/12/08 1736 2020-12-08 2146 10/28/2020 0419  WBC 9.7  --   --  10.8*  LATICACIDVEN  --  10.7* >11.0*  --  Liver Function Tests: Recent Labs  Lab 11/04/2020 1402  AST 189*  ALT 127*  ALKPHOS 69  BILITOT 0.7  PROT 5.1*  ALBUMIN 2.9*    No results for input(s): LIPASE, AMYLASE in the last 168 hours. No results for input(s): AMMONIA in the last 168 hours.  ABG    Component Value Date/Time   PHART 7.047 (LL) 12-10-20 0518   PCO2ART 59.0 (H) 12-10-2020 0518   PO2ART 33 (LL) Dec 10, 2020 0518   HCO3 16.9 (L) 12/10/2020 0518   TCO2 19 (L) 12/10/20 0518   ACIDBASEDEF 15.0 (H) 12/10/2020 0518   O2SAT 50.0 12/10/20 0518      Coagulation Profile: No results for input(s): INR, PROTIME in the last 168 hours.  Cardiac Enzymes: No results for input(s): CKTOTAL, CKMB, CKMBINDEX, TROPONINI in the last 168 hours.  HbA1C: No results found for: HGBA1C  CBG: Recent Labs  Lab 12/10/2020 0333 2020/12/10 0433 December 10, 2020 0612 Dec 10, 2020 0718 12-10-2020 0825  GLUCAP 435* 399* 330* 300* 273*     Critical care time: 77m independent of procedures      Cyril Mourning MD. FCCP. [facility] Pager : [phone]  If no response to pager , please call [phone] until 7 pm After 7:00 pm call Elink  [phone]   10-Dec-2020

## 2020-11-22 NOTE — Progress Notes (Signed)
Pt DNR, family at bedside. Pt asystole on heart monitor. Pt's lungs and heart auscultated no sounds noted. Pt pronounced at 1015 with MD order, by myself Julius Bowels, RN and Burnard Bunting, RN.

## 2020-11-22 NOTE — Progress Notes (Signed)
Critical ABG results reported to Elink.  pH 7.04  CO2 61 pO2 44 hCO3 17  No further orders at this time.

## 2020-11-22 NOTE — Progress Notes (Signed)
eLink Physician-Brief Progress Note Patient Name: Daton Szilagyi DOB: 1961/03/02 MRN: [id number]   Date of Service  November 15, 2020  HPI/Events of Note  Called sister Agustin Cree on the phone and spoke with fiance Kim through video. Updated them of CT scan findings. Head Ct with diffuse cerebral edema and abdominal CT with portal venous gas and bowel dilatation concerning for bowel ischemia. Both significant poor outcomes.  eICU Interventions  Discussed with them regarding code status. They will discuss amongst them together with brother and an incarcerated son.     Intervention Category Intermediate Interventions: Diagnostic test evaluation Minor Interventions: Communication with other healthcare providers and/or family  Darl Pikes November 15, 2020, 3:16 AM

## 2020-11-22 DEATH — deceased

## 2022-11-26 IMAGING — CT CT ABD-PELV W/O CM
2 of 4 series · 16 of 46 positions shown, 18 images · non-contrast
Comparison: None.

CLINICAL DATA: Assess for bowel obstruction.

EXAM:
CT ABDOMEN AND PELVIS WITHOUT CONTRAST
TECHNIQUE: Multidetector CT imaging of the abdomen and pelvis was performed
following the standard protocol without IV contrast.

[Series 3: a/p w/o 5mm · axial · non-contrast · 0.82mm/px · z∈[-1020,-515]mm · 13 of 111 slices shown, 15 images]
[im 5/111  soft-tissue]
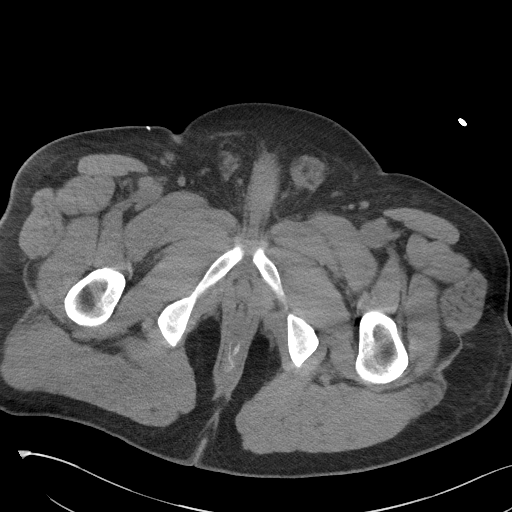
[im 5/111  bone]
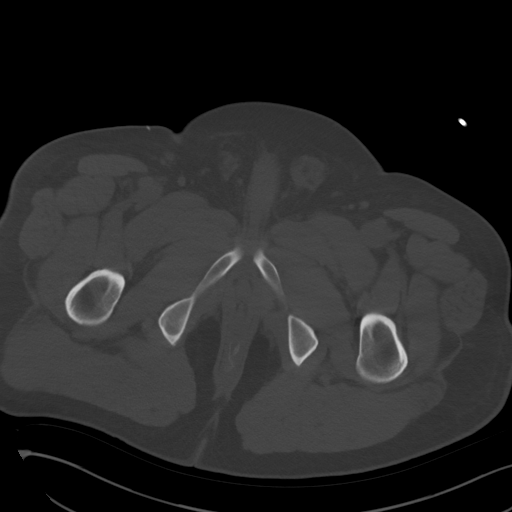
[im 13/111  soft-tissue]
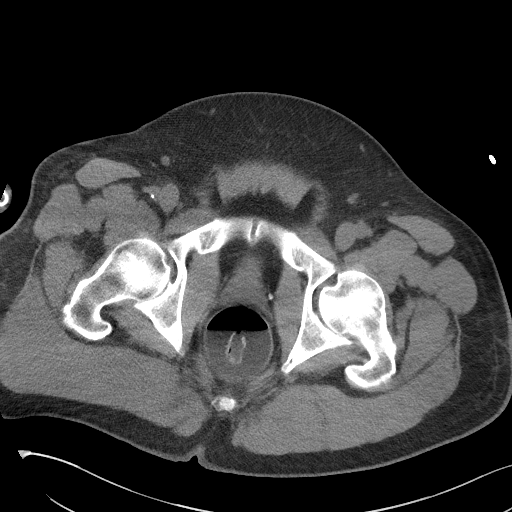
[im 22/111  soft-tissue]
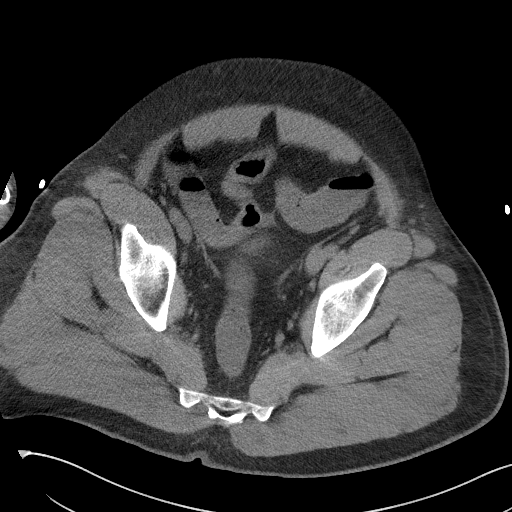
[im 30/111  soft-tissue]
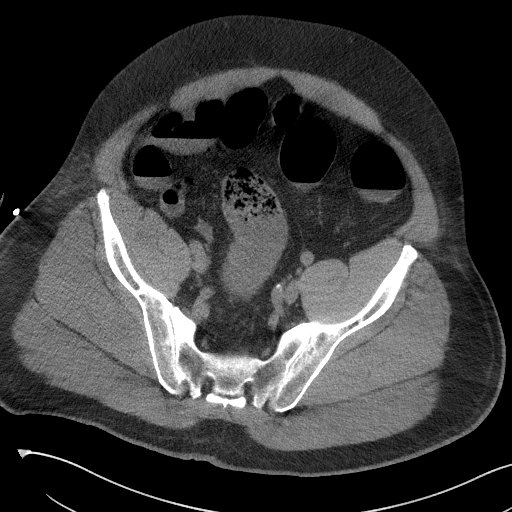
[im 39/111  soft-tissue]
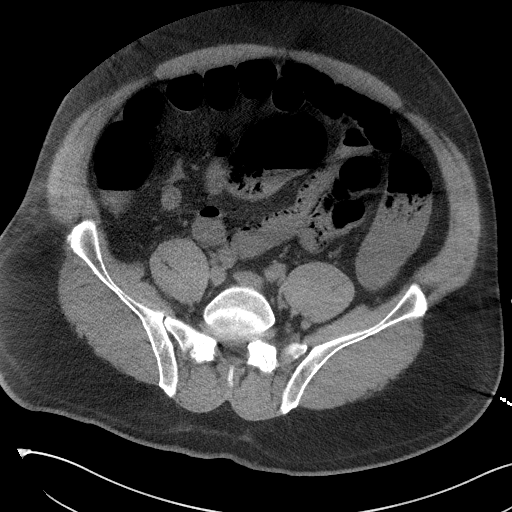
[im 47/111  soft-tissue]
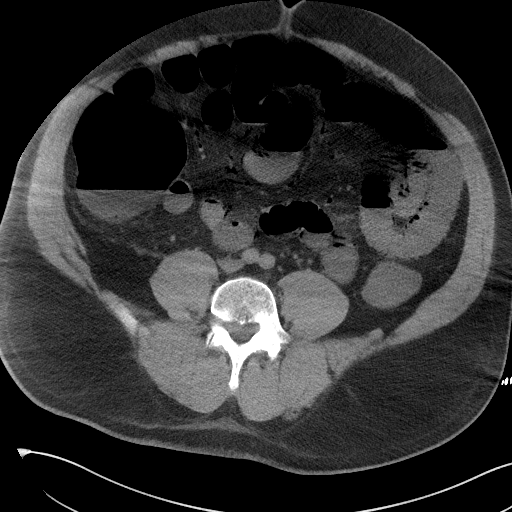
[im 56/111  soft-tissue]
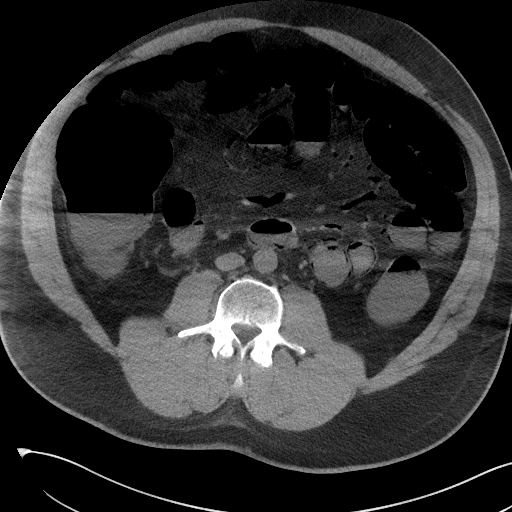
[im 64/111  soft-tissue]
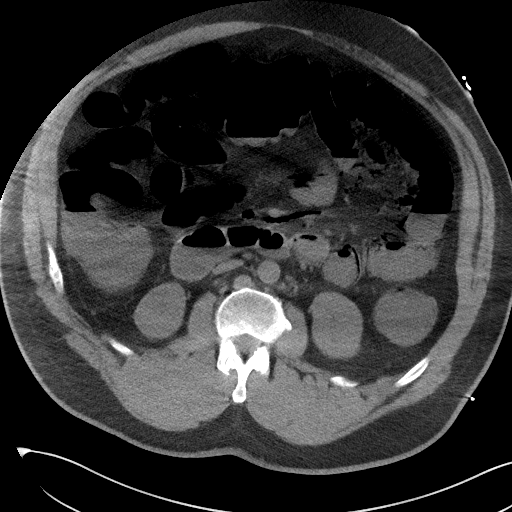
[im 72/111  soft-tissue]
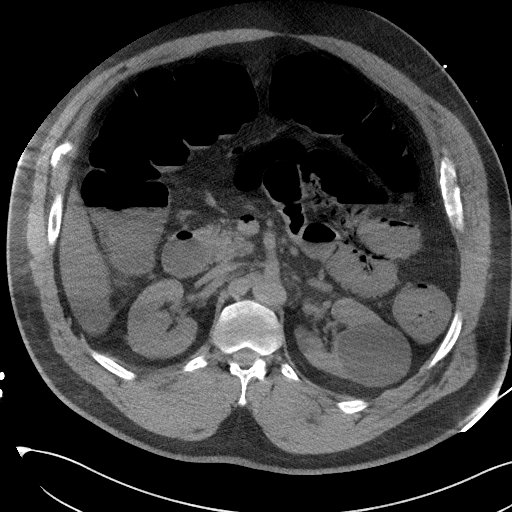
[im 72/111  bone]
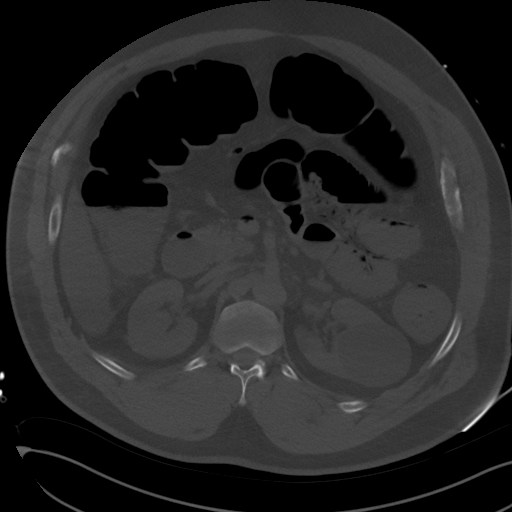
[im 81/111  soft-tissue]
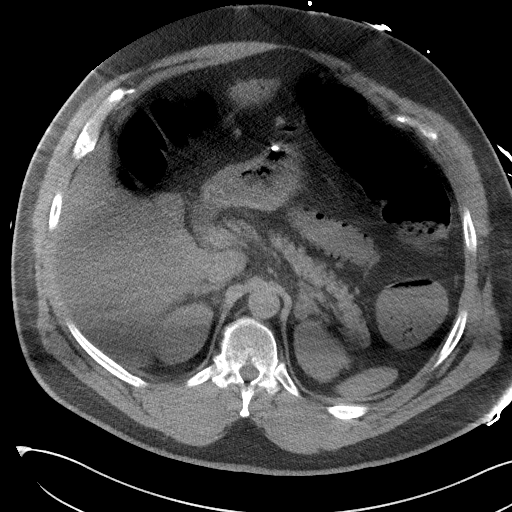
[im 89/111  soft-tissue]
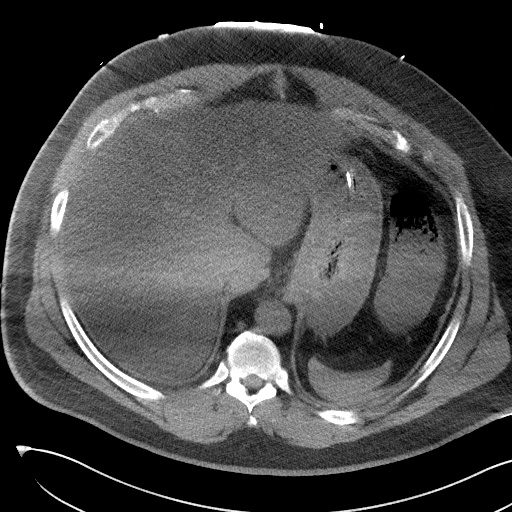
[im 98/111  soft-tissue]
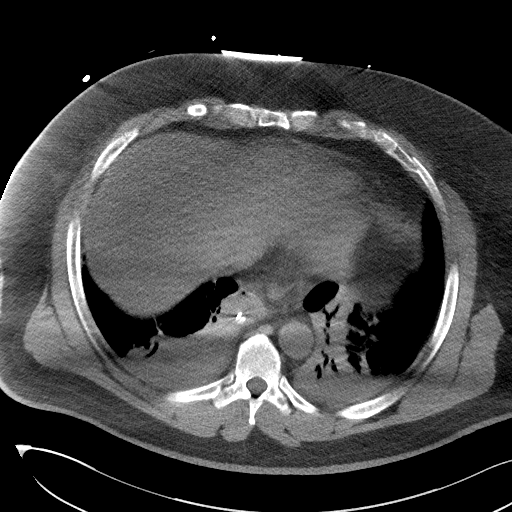
[im 106/111  soft-tissue]
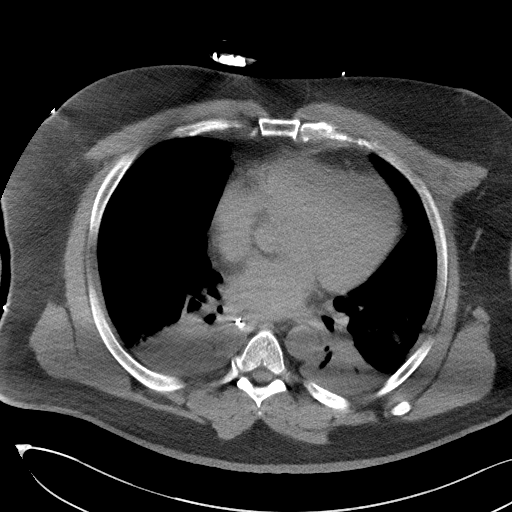

[Series 5: a/p w/o cor · coronal · non-contrast · 1.00mm/px · 3 of 196 slices shown]
[im 66/196  soft-tissue]
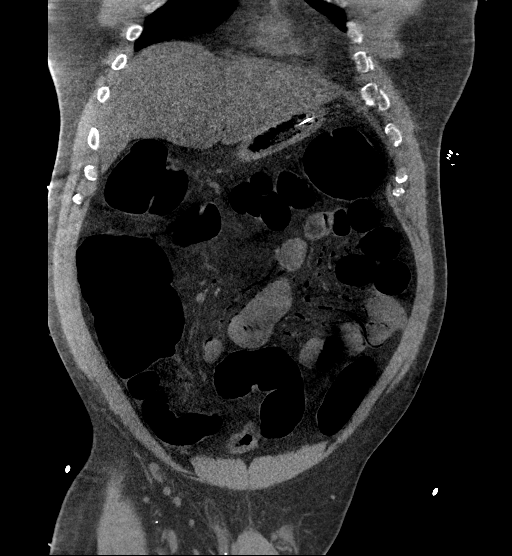
[im 87/196  soft-tissue]
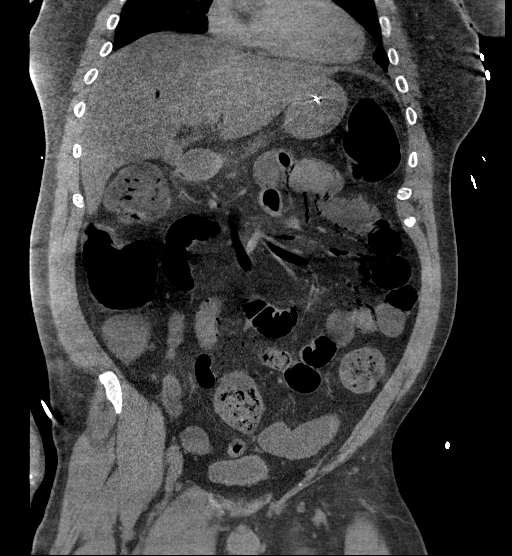
[im 109/196  soft-tissue]
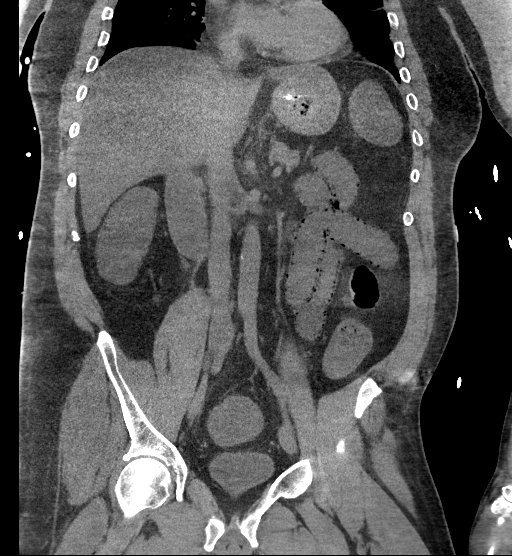

[16 of 46 positions shown; findings below may reference images not displayed]

FINDINGS: Lower chest: Bilateral lower lobe airspace consolidation identified
compatible with aspiration and or pneumonia.

Hepatobiliary: Diffuse hepatic steatosis. A few tiny foci of portal
venous gas noted. No focal liver abnormality.

Pancreas: Unremarkable. No pancreatic ductal dilatation or
surrounding inflammatory changes.

Spleen: Normal in size without focal abnormality.

Adrenals/Urinary Tract: Left adrenal nodule measures 1.8 cm and 15
Hounsfield units. Right kidney normal. Lateral left kidney cyst
measures 6.1 cm. No signs of nephrolithiasis or hydronephrosis.
Urinary bladder is unremarkable.

Stomach/Bowel: NG tube tip is in the distal body of stomach. Stomach
is otherwise unremarkable. Diffuse gaseous distension of the large
and small bowel loops are identified. No bowel wall thickening or
inflammation. Small bowel pneumatosis with diffuse portal venous gas
is identified.

Vascular/Lymphatic: Mild aortic atherosclerosis. No aneurysm. No
abdominal no pelvic adenopathy.

Reproductive: Prostate is unremarkable.

Other: No pneumoperitoneum.  No free fluid or fluid collections.

Musculoskeletal: No acute or significant osseous findings.
IMPRESSION: 1. Diffuse gaseous distension of the large and small bowel loops are
identified. Small bowel pneumatosis with diffuse portal venous gas
is identified. Findings are concerning for bowel ischemia.
2. Bilateral lower lobe airspace consolidation compatible with
aspiration and/or pneumonia.
3. Hepatic steatosis.
4. Indeterminate left adrenal nodule.
5. Aortic atherosclerosis.

Aortic Atherosclerosis ([id number]).

Critical Value/emergent results were called by telephone at the time
of interpretation on 11/12/2020 at [DATE] to provider Dr. Epelbaum,
who verbally acknowledged these results.
